# Patient Record
Sex: Female | Born: 1970 | Race: White | Hispanic: No | Marital: Single | Smoking: Never smoker
Health system: Southern US, Community
[De-identification: ages and names within clinical notes are randomized; demographics above are authoritative.]

## PROBLEM LIST (undated history)

## (undated) DIAGNOSIS — N938 Other specified abnormal uterine and vaginal bleeding: Secondary | ICD-10-CM

## (undated) DIAGNOSIS — N9489 Other specified conditions associated with female genital organs and menstrual cycle: Secondary | ICD-10-CM

## (undated) DIAGNOSIS — M1990 Unspecified osteoarthritis, unspecified site: Secondary | ICD-10-CM

## (undated) DIAGNOSIS — T7840XA Allergy, unspecified, initial encounter: Secondary | ICD-10-CM

## (undated) DIAGNOSIS — Z9289 Personal history of other medical treatment: Secondary | ICD-10-CM

## (undated) DIAGNOSIS — R7611 Nonspecific reaction to tuberculin skin test without active tuberculosis: Secondary | ICD-10-CM

## (undated) DIAGNOSIS — Z8619 Personal history of other infectious and parasitic diseases: Secondary | ICD-10-CM

## (undated) HISTORY — PX: CERVICAL DISCECTOMY: SHX98

## (undated) HISTORY — PX: ANTERIOR CRUCIATE LIGAMENT REPAIR: SHX115

## (undated) HISTORY — DX: Allergy, unspecified, initial encounter: T78.40XA

## (undated) HISTORY — DX: Personal history of other infectious and parasitic diseases: Z86.19

## (undated) HISTORY — PX: QUADRICEPS TENDON REPAIR: SHX756

## (undated) HISTORY — DX: Nonspecific reaction to tuberculin skin test without active tuberculosis: R76.11

## (undated) HISTORY — PX: OTHER SURGICAL HISTORY: SHX169

---

## 1994-06-04 HISTORY — PX: ANTERIOR CRUCIATE LIGAMENT (ACL) REVISION: SHX6707

## 1997-10-14 ENCOUNTER — Ambulatory Visit (HOSPITAL_COMMUNITY): Admission: RE | Admit: 1997-10-14 | Discharge: 1997-10-14 | Payer: Self-pay | Admitting: Obstetrics and Gynecology

## 1998-04-23 ENCOUNTER — Observation Stay (HOSPITAL_COMMUNITY): Admission: RE | Admit: 1998-04-23 | Discharge: 1998-04-24 | Payer: Self-pay | Admitting: Specialist

## 1998-04-23 ENCOUNTER — Encounter: Payer: Self-pay | Admitting: Specialist

## 2001-03-21 ENCOUNTER — Encounter: Admission: RE | Admit: 2001-03-21 | Discharge: 2001-03-21 | Payer: Self-pay | Admitting: Obstetrics and Gynecology

## 2001-03-21 ENCOUNTER — Encounter: Payer: Self-pay | Admitting: Obstetrics and Gynecology

## 2001-09-29 ENCOUNTER — Encounter: Payer: Self-pay | Admitting: Obstetrics and Gynecology

## 2001-09-29 ENCOUNTER — Encounter: Admission: RE | Admit: 2001-09-29 | Discharge: 2001-09-29 | Payer: Self-pay | Admitting: Obstetrics and Gynecology

## 2002-08-12 ENCOUNTER — Other Ambulatory Visit: Admission: RE | Admit: 2002-08-12 | Discharge: 2002-08-12 | Payer: Self-pay | Admitting: Obstetrics and Gynecology

## 2006-06-21 ENCOUNTER — Encounter: Admission: RE | Admit: 2006-06-21 | Discharge: 2006-06-21 | Payer: Self-pay | Admitting: Obstetrics and Gynecology

## 2006-07-03 ENCOUNTER — Encounter: Admission: RE | Admit: 2006-07-03 | Discharge: 2006-07-03 | Payer: Self-pay | Admitting: Obstetrics and Gynecology

## 2012-06-18 ENCOUNTER — Other Ambulatory Visit: Payer: Self-pay | Admitting: Obstetrics and Gynecology

## 2012-06-18 DIAGNOSIS — Z1231 Encounter for screening mammogram for malignant neoplasm of breast: Secondary | ICD-10-CM

## 2012-07-16 ENCOUNTER — Ambulatory Visit
Admission: RE | Admit: 2012-07-16 | Discharge: 2012-07-16 | Disposition: A | Discharge status: Home / Self Care | Payer: No Typology Code available for payment source | Source: Ambulatory Visit | Attending: Obstetrics and Gynecology | Admitting: Obstetrics and Gynecology

## 2012-07-16 DIAGNOSIS — Z1231 Encounter for screening mammogram for malignant neoplasm of breast: Secondary | ICD-10-CM

## 2015-06-05 HISTORY — PX: QUADRICEPS TENDON REPAIR: SHX756

## 2016-10-10 ENCOUNTER — Ambulatory Visit: Payer: No Typology Code available for payment source | Admitting: *Deleted

## 2016-10-17 ENCOUNTER — Ambulatory Visit: Payer: No Typology Code available for payment source | Admitting: *Deleted

## 2017-03-04 LAB — HM PAP SMEAR

## 2017-03-20 LAB — HEPATIC FUNCTION PANEL
ALT: 11 (ref 7–35)
AST: 23 (ref 13–35)
Alkaline Phosphatase: 41 (ref 25–125)
BILIRUBIN, TOTAL: 0.9

## 2017-03-20 LAB — LIPID PANEL
CHOLESTEROL: 174 (ref 0–200)
HDL: 103 — AB (ref 35–70)
LDL CALC: 57
LDL/HDL RATIO: 1.7
TRIGLYCERIDES: 63 (ref 40–160)

## 2017-03-20 LAB — BASIC METABOLIC PANEL
CREATININE: 1 (ref 0.5–1.1)
Glucose: 91
POTASSIUM: 5.3 (ref 3.4–5.3)
Sodium: 140 (ref 137–147)

## 2017-03-20 LAB — TSH: TSH: 2.24 (ref 0.41–5.90)

## 2017-03-20 LAB — CBC AND DIFFERENTIAL
HEMATOCRIT: 35 — AB (ref 36–46)
Hemoglobin: 11.8 — AB (ref 12.0–16.0)
Platelets: 285 (ref 150–399)
WBC: 4.8

## 2017-03-20 LAB — HEMOGLOBIN A1C: HEMOGLOBIN A1C: 5.4

## 2017-03-26 ENCOUNTER — Other Ambulatory Visit: Payer: Self-pay | Admitting: Obstetrics and Gynecology

## 2017-03-26 DIAGNOSIS — N6320 Unspecified lump in the left breast, unspecified quadrant: Secondary | ICD-10-CM

## 2017-04-03 ENCOUNTER — Ambulatory Visit
Admission: RE | Admit: 2017-04-03 | Discharge: 2017-04-03 | Disposition: A | Discharge status: Home / Self Care | Payer: 59 | Source: Ambulatory Visit | Attending: Obstetrics and Gynecology | Admitting: Obstetrics and Gynecology

## 2017-04-03 DIAGNOSIS — N6320 Unspecified lump in the left breast, unspecified quadrant: Secondary | ICD-10-CM

## 2017-04-03 LAB — HM MAMMOGRAPHY: HM Mammogram: NORMAL (ref 0–4)

## 2017-10-14 ENCOUNTER — Other Ambulatory Visit: Payer: Self-pay

## 2017-10-14 ENCOUNTER — Encounter: Payer: Self-pay | Admitting: Family Medicine

## 2017-10-14 ENCOUNTER — Ambulatory Visit (INDEPENDENT_AMBULATORY_CARE_PROVIDER_SITE_OTHER): Payer: BLUE CROSS/BLUE SHIELD | Admitting: Family Medicine

## 2017-10-14 VITALS — BP 101/68 | HR 66 | Temp 98.2°F | Resp 16 | Ht 66.5 in | Wt 141.0 lb

## 2017-10-14 DIAGNOSIS — G4700 Insomnia, unspecified: Secondary | ICD-10-CM

## 2017-10-14 DIAGNOSIS — L989 Disorder of the skin and subcutaneous tissue, unspecified: Secondary | ICD-10-CM

## 2017-10-14 MED ORDER — TRAZODONE HCL 50 MG PO TABS: 25.0000 mg | ORAL_TABLET | Freq: Every evening | ORAL | 3 refills | Status: DC | PRN

## 2017-10-14 NOTE — Patient Instructions (Signed)
Schedule your complete physical in 6 months Start the Trazodone nightly as needed.  Start w/ 1/2 tab and increase to 1 tab as needed We'll call you with your derm referral Keep up the good work on healthy diet and regular exercise- you look great! Call with any questions or concerns Welcome!  We're glad to have you!!!

## 2017-10-14 NOTE — Progress Notes (Signed)
   Subjective:    Patient ID: Bailey Hernandez, female    DOB: [birth year], 47 y.o.   MRN: [id number]  HPI New to establish.  No previous PCP.  GYN- Cousins, UTD on mammo and pap  Insomnia- pt has hx of poor sleep.  3 yrs ago was given a script for Restoril.  Has tried Melatonin, Benadryl, Sleepy Time tea.  Restoril will help her get to sleep but even w/ medication will wake 3-4x/night.  Benadryl causes her to wake 'groggy'.  Has never been on Trazodone.  Pt is not interested in taking something nightly.  Skin lesion- R shoulder/back of neck.  Not painful but will scab repeatedly.  Interested in seeing Derm.  Hx of non-melanoma skin cancer.     Review of Systems For ROS see HPI     Objective:   Physical Exam  Constitutional: She is oriented to person, place, and time. She appears well-developed and well-nourished. No distress.  HENT:  Head: Normocephalic and atraumatic.  Eyes: Pupils are equal, round, and reactive to light. Conjunctivae and EOM are normal.  Neck: Normal range of motion. Neck supple. No thyromegaly present.  Cardiovascular: Normal rate, regular rhythm, normal heart sounds and intact distal pulses.  No murmur heard. Pulmonary/Chest: Effort normal and breath sounds normal. No respiratory distress.  Abdominal: Soft. She exhibits no distension. There is no tenderness.  Musculoskeletal: She exhibits no edema.  Lymphadenopathy:    She has no cervical adenopathy.  Neurological: She is alert and oriented to person, place, and time.  Skin: Skin is warm and dry.  Scabbing, pearly lesion on R posterior shoulder  Psychiatric: She has a normal mood and affect. Her behavior is normal.  Vitals reviewed.         Assessment & Plan:  Insomnia- new to provider, ongoing for pt.  Has been taking the Restoril as needed but doesn't feel this is as effective as it should be.  Has not been on Trazodone in the past.  Will start Trazodone and monitor for improvement.  Pt expressed  understanding and is in agreement w/ plan.   Skin lesion- pt has hx of non-melanoma skin cancer and now w/ pearly, scabbed lesion on R posterior shoulder.  Refer to Derm for complete evaluation/tx.

## 2017-10-16 ENCOUNTER — Encounter: Payer: Self-pay | Admitting: Family Medicine

## 2017-10-23 ENCOUNTER — Encounter: Payer: Self-pay | Admitting: General Practice

## 2017-11-27 ENCOUNTER — Other Ambulatory Visit: Payer: Self-pay | Admitting: Physician Assistant

## 2017-11-27 DIAGNOSIS — C4441 Basal cell carcinoma of skin of scalp and neck: Secondary | ICD-10-CM | POA: Diagnosis not present

## 2018-05-07 ENCOUNTER — Encounter: Payer: Self-pay | Admitting: Family Medicine

## 2018-05-07 ENCOUNTER — Ambulatory Visit (INDEPENDENT_AMBULATORY_CARE_PROVIDER_SITE_OTHER): Payer: BLUE CROSS/BLUE SHIELD | Admitting: Family Medicine

## 2018-05-07 ENCOUNTER — Other Ambulatory Visit: Payer: Self-pay

## 2018-05-07 VITALS — BP 100/62 | HR 65 | Temp 98.0°F | Resp 16 | Ht 67.0 in | Wt 143.2 lb

## 2018-05-07 DIAGNOSIS — Z0000 Encounter for general adult medical examination without abnormal findings: Secondary | ICD-10-CM

## 2018-05-07 DIAGNOSIS — E785 Hyperlipidemia, unspecified: Secondary | ICD-10-CM | POA: Diagnosis not present

## 2018-05-07 LAB — BASIC METABOLIC PANEL
BUN: 12 mg/dL (ref 6–23)
CO2: 25 mEq/L (ref 19–32)
Calcium: 9.1 mg/dL (ref 8.4–10.5)
Chloride: 106 mEq/L (ref 96–112)
Creatinine, Ser: 0.8 mg/dL (ref 0.40–1.20)
GFR: 81 mL/min (ref >60.00)
Glucose, Bld: 80 mg/dL (ref 70–99)
Potassium: 4.3 mEq/L (ref 3.5–5.1)
SODIUM: 139 meq/L (ref 135–145)

## 2018-05-07 LAB — HEPATIC FUNCTION PANEL
ALT: 15 U/L (ref 0–35)
AST: 24 U/L (ref 0–37)
Albumin: 4.3 g/dL (ref 3.5–5.2)
Alkaline Phosphatase: 39 U/L (ref 39–117)
BILIRUBIN DIRECT: 0.2 mg/dL (ref 0.0–0.3)
Total Bilirubin: 1.4 mg/dL — ABNORMAL HIGH (ref 0.2–1.2)
Total Protein: 6.7 g/dL (ref 6.0–8.3)

## 2018-05-07 LAB — LIPID PANEL
Cholesterol: 170 mg/dL (ref 0–200)
HDL: 93.2 mg/dL (ref >39.00)
LDL Cholesterol: 65 mg/dL (ref 0–99)
NonHDL: 76.36
Total CHOL/HDL Ratio: 2
Triglycerides: 58 mg/dL (ref 0.0–149.0)
VLDL: 11.6 mg/dL (ref 0.0–40.0)

## 2018-05-07 LAB — CBC WITH DIFFERENTIAL/PLATELET
Basophils Absolute: 0.2 10*3/uL — ABNORMAL HIGH (ref 0.0–0.1)
Basophils Relative: 4.1 % — ABNORMAL HIGH (ref 0.0–3.0)
Eosinophils Absolute: 0.1 10*3/uL (ref 0.0–0.7)
Eosinophils Relative: 2 % (ref 0.0–5.0)
HCT: 39.2 % (ref 36.0–46.0)
Hemoglobin: 12.9 g/dL (ref 12.0–15.0)
Lymphocytes Relative: 27.6 % (ref 12.0–46.0)
Lymphs Abs: 1.1 10*3/uL (ref 0.7–4.0)
MCHC: 32.8 g/dL (ref 30.0–36.0)
MCV: 91.8 fl (ref 78.0–100.0)
Monocytes Absolute: 0.5 10*3/uL (ref 0.1–1.0)
Monocytes Relative: 11 % (ref 3.0–12.0)
Neutro Abs: 2.3 10*3/uL (ref 1.4–7.7)
Neutrophils Relative %: 55.3 % (ref 43.0–77.0)
Platelets: 310 10*3/uL (ref 150.0–400.0)
RBC: 4.27 Mil/uL (ref 3.87–5.11)
RDW: 14.6 % (ref 11.5–15.5)
WBC: 4.1 10*3/uL (ref 4.0–10.5)

## 2018-05-07 LAB — TSH: TSH: 2.4 u[IU]/mL (ref 0.35–4.50)

## 2018-05-07 NOTE — Assessment & Plan Note (Signed)
Pt's PE WNL.  UTD on GYN, immunizations.  Check labs.  Anticipatory guidance provided.  

## 2018-05-07 NOTE — Progress Notes (Signed)
   Subjective:    Patient ID: Bailey Hernandez, female    DOB: 09/28/1970, 47 y.o.   MRN: [id number]  HPI CPE- UTD on flu, Tdap.  UTD w/ Dr Cherly Hensenousins (GYN)   Review of Systems Patient reports no vision/ hearing changes, adenopathy,fever, weight change,  persistant/recurrent hoarseness , swallowing issues, chest pain, palpitations, edema, persistant/recurrent cough, hemoptysis, dyspnea (rest/exertional/paroxysmal nocturnal), gastrointestinal bleeding (melena, rectal bleeding), abdominal pain, significant heartburn, bowel changes, GU symptoms (dysuria, hematuria, incontinence), Gyn symptoms (abnormal  bleeding, pain),  syncope, focal weakness, memory loss, numbness & tingling, skin/hair/nail changes, abnormal bruising or bleeding, anxiety, or depression.     Objective:   Physical Exam General Appearance:    Alert, cooperative, no distress, appears stated age  Head:    Normocephalic, without obvious abnormality, atraumatic  Eyes:    PERRL, conjunctiva/corneas clear, EOM's intact, fundi    benign, both eyes  Ears:    Normal TM's and external ear canals, both ears  Nose:   Nares normal, septum midline, mucosa normal, no drainage    or sinus tenderness  Throat:   Lips, mucosa, and tongue normal; teeth and gums normal  Neck:   Supple, symmetrical, trachea midline, no adenopathy;    Thyroid: no enlargement/tenderness/nodules  Back:     Symmetric, no curvature, ROM normal, no CVA tenderness  Lungs:     Clear to auscultation bilaterally, respirations unlabored  Chest Wall:    No tenderness or deformity   Heart:    Regular rate and rhythm, S1 and S2 normal, no murmur, rub   or gallop  Breast Exam:    Deferred to GYN  Abdomen:     Soft, non-tender, bowel sounds active all four quadrants,    no masses, no organomegaly  Genitalia:    Deferred to GYN  Rectal:    Extremities:   Extremities normal, atraumatic, no cyanosis or edema  Pulses:   2+ and symmetric all extremities  Skin:   Skin color,  texture, turgor normal, no rashes or lesions  Lymph nodes:   Cervical, supraclavicular, and axillary nodes normal  Neurologic:   CNII-XII intact, normal strength, sensation and reflexes    throughout          Assessment & Plan:

## 2018-05-07 NOTE — Patient Instructions (Signed)
Follow up in 1 year or as needed We'll notify you of your lab results and make any changes if needed Keep up the good work!  You look great!! Call with any questions or concerns Happy Holidays!!! 

## 2018-09-01 DIAGNOSIS — Z124 Encounter for screening for malignant neoplasm of cervix: Secondary | ICD-10-CM | POA: Diagnosis not present

## 2018-09-01 DIAGNOSIS — Z118 Encounter for screening for other infectious and parasitic diseases: Secondary | ICD-10-CM | POA: Diagnosis not present

## 2018-09-01 DIAGNOSIS — Z1151 Encounter for screening for human papillomavirus (HPV): Secondary | ICD-10-CM | POA: Diagnosis not present

## 2018-09-01 DIAGNOSIS — Z113 Encounter for screening for infections with a predominantly sexual mode of transmission: Secondary | ICD-10-CM | POA: Diagnosis not present

## 2018-09-01 DIAGNOSIS — Z6822 Body mass index (BMI) 22.0-22.9, adult: Secondary | ICD-10-CM | POA: Diagnosis not present

## 2018-09-01 DIAGNOSIS — Z01419 Encounter for gynecological examination (general) (routine) without abnormal findings: Secondary | ICD-10-CM | POA: Diagnosis not present

## 2018-09-05 ENCOUNTER — Other Ambulatory Visit: Payer: Self-pay | Admitting: Obstetrics and Gynecology

## 2018-09-05 DIAGNOSIS — N6320 Unspecified lump in the left breast, unspecified quadrant: Secondary | ICD-10-CM

## 2018-09-25 ENCOUNTER — Ambulatory Visit
Admission: RE | Admit: 2018-09-25 | Discharge: 2018-09-25 | Disposition: A | Discharge status: Home / Self Care | Payer: BLUE CROSS/BLUE SHIELD | Source: Ambulatory Visit | Attending: Obstetrics and Gynecology | Admitting: Obstetrics and Gynecology

## 2018-09-25 ENCOUNTER — Other Ambulatory Visit: Payer: Self-pay

## 2018-09-25 DIAGNOSIS — N6320 Unspecified lump in the left breast, unspecified quadrant: Secondary | ICD-10-CM

## 2019-02-03 ENCOUNTER — Encounter: Payer: Self-pay | Admitting: Family Medicine

## 2019-04-01 DIAGNOSIS — R898 Other abnormal findings in specimens from other organs, systems and tissues: Secondary | ICD-10-CM | POA: Diagnosis not present

## 2019-04-01 DIAGNOSIS — N939 Abnormal uterine and vaginal bleeding, unspecified: Secondary | ICD-10-CM | POA: Diagnosis not present

## 2019-04-15 DIAGNOSIS — R1900 Intra-abdominal and pelvic swelling, mass and lump, unspecified site: Secondary | ICD-10-CM | POA: Diagnosis not present

## 2019-04-15 DIAGNOSIS — N938 Other specified abnormal uterine and vaginal bleeding: Secondary | ICD-10-CM | POA: Diagnosis not present

## 2019-04-15 DIAGNOSIS — R9389 Abnormal findings on diagnostic imaging of other specified body structures: Secondary | ICD-10-CM | POA: Diagnosis not present

## 2019-04-24 ENCOUNTER — Other Ambulatory Visit: Payer: Self-pay | Admitting: Obstetrics and Gynecology

## 2019-05-11 ENCOUNTER — Other Ambulatory Visit: Payer: Self-pay

## 2019-05-11 ENCOUNTER — Encounter (HOSPITAL_BASED_OUTPATIENT_CLINIC_OR_DEPARTMENT_OTHER): Payer: Self-pay | Admitting: *Deleted

## 2019-05-11 NOTE — Progress Notes (Signed)
Spoke w/ via phone for pre-op interview--- PT Lab needs dos----  CBC, Urine preg             Lab results------ none COVID test ------ 05-15-2019 @ 1400 Arrive at ------- 1230 NPO after ------ MN w/ exception clear liquids until 0830 then nothing by mouth (no cream/ milk products) Medications to take morning of surgery ----- NONE Diabetic medication ----- n/a Patient Special Instructions ----- n/a Pre-Op special Istructions ----- n/a Patient verbalized understanding of instructions that were given at this phone interview. Patient denies shortness of breath, chest pain, fever, cough a this phone interview.

## 2019-05-13 ENCOUNTER — Other Ambulatory Visit: Payer: Self-pay

## 2019-05-13 ENCOUNTER — Telehealth: Payer: Self-pay

## 2019-05-13 ENCOUNTER — Ambulatory Visit (INDEPENDENT_AMBULATORY_CARE_PROVIDER_SITE_OTHER): Payer: BC Managed Care – PPO | Admitting: Family Medicine

## 2019-05-13 ENCOUNTER — Encounter: Payer: Self-pay | Admitting: Family Medicine

## 2019-05-13 DIAGNOSIS — Z0000 Encounter for general adult medical examination without abnormal findings: Secondary | ICD-10-CM | POA: Diagnosis not present

## 2019-05-13 NOTE — Progress Notes (Signed)
I have discussed the procedure for the virtual visit with the patient who has given consent to proceed with assessment and treatment.   Pt unable to obtain vitals.   Bailey Hernandez, CMA     

## 2019-05-13 NOTE — Progress Notes (Signed)
   Virtual Visit via Video   I connected with patient on 05/13/19 at  8:00 AM EST by a video enabled telemedicine application and verified that I am speaking with the correct person using two identifiers.  Location patient: Home Location provider: Acup[facility]ist, Office Persons participating in the virtual visit: Patient, Provider, Antelope (Jess B)  I discussed the limitations of evaluation and management by telemedicine and the availability of in person appointments. The patient expressed understanding and agreed to proceed.  Subjective:   HPI:   CPE- UTD on GYN (has surgery upcoming), due for colonoscopy as she just turned 53.  UTD on immunizations.  ROS:  Patient reports no vision/ hearing changes, adenopathy,fever, weight change,  persistant/recurrent hoarseness , swallowing issues, chest pain, palpitations, edema, persistant/recurrent cough, hemoptysis, dyspnea (rest/exertional/paroxysmal nocturnal), gastrointestinal bleeding (melena, rectal bleeding), abdominal pain, significant heartburn, bowel changes, GU symptoms (dysuria, hematuria, incontinence), Gyn symptoms (abnormal  bleeding, pain),  syncope, focal weakness, memory loss, numbness & tingling, skin/hair/nail changes, abnormal bruising, anxiety, or depression.   + DUB  Patient Active Problem List   Diagnosis Date Noted  . Physical exam 05/07/2018    Social History   Tobacco Use  . Smoking status: Never Smoker  . Smokeless tobacco: Never Used  Substance Use Topics  . Alcohol use: Yes    Comment: social    Current Outpatient Medications:  .  megestrol (MEGACE) 40 MG tablet, Take 40 mg by mouth daily., Disp: , Rfl:  .  Omega-3 Fatty Acids (FISH OIL) 1000 MG CAPS, Take 2 capsules by mouth daily., Disp: , Rfl:   Allergies  Allergen Reactions  . Cephalexin Rash    Objective:   LMP  (LMP Unknown)  AAOx3, NAD NCAT, EOMI No obvious CN deficits Coloring WNL Pt is able to speak clearly, coherently without  shortness of breath or increased work of breathing.  Thought process is linear.  Mood is appropriate.   Assessment and Plan:   CPE- UTD on GYN, immunizations.  Due for colon cancer screen.  Prefers Cologuard due to [place].  Will hold until Jan 1 when her insurance changes.  Will get labs to risk stratify.  Anticipatory guidance provided.    Annye Asa, MD 05/13/2019

## 2019-05-13 NOTE — Telephone Encounter (Signed)
LMOVM advising patient to schedule CPE in 1 year 

## 2019-05-15 ENCOUNTER — Other Ambulatory Visit (HOSPITAL_COMMUNITY)
Admission: RE | Admit: 2019-05-15 | Discharge: 2019-05-15 | Disposition: A | Discharge status: Home / Self Care | Payer: BC Managed Care – PPO | Source: Ambulatory Visit | Attending: Obstetrics and Gynecology | Admitting: Obstetrics and Gynecology

## 2019-05-15 DIAGNOSIS — Z01812 Encounter for preprocedural laboratory examination: Secondary | ICD-10-CM | POA: Insufficient documentation

## 2019-05-15 DIAGNOSIS — Z20828 Contact with and (suspected) exposure to other viral communicable diseases: Secondary | ICD-10-CM | POA: Diagnosis not present

## 2019-05-15 LAB — SARS CORONAVIRUS 2 (TAT 6-24 HRS): SARS Coronavirus 2: NEGATIVE

## 2019-05-18 ENCOUNTER — Ambulatory Visit (HOSPITAL_BASED_OUTPATIENT_CLINIC_OR_DEPARTMENT_OTHER): Payer: BC Managed Care – PPO | Admitting: Anesthesiology

## 2019-05-18 ENCOUNTER — Encounter (HOSPITAL_BASED_OUTPATIENT_CLINIC_OR_DEPARTMENT_OTHER): Payer: Self-pay | Admitting: Obstetrics and Gynecology

## 2019-05-18 ENCOUNTER — Encounter (HOSPITAL_BASED_OUTPATIENT_CLINIC_OR_DEPARTMENT_OTHER)
Admission: RE | Disposition: A | Discharge status: Home / Self Care | Payer: Self-pay | Source: Home / Self Care | Attending: Obstetrics and Gynecology

## 2019-05-18 ENCOUNTER — Ambulatory Visit (HOSPITAL_BASED_OUTPATIENT_CLINIC_OR_DEPARTMENT_OTHER)
Admission: RE | Admit: 2019-05-18 | Discharge: 2019-05-18 | Disposition: A | Discharge status: Home / Self Care | Payer: BC Managed Care – PPO | Attending: Obstetrics and Gynecology | Admitting: Obstetrics and Gynecology

## 2019-05-18 DIAGNOSIS — D649 Anemia, unspecified: Secondary | ICD-10-CM | POA: Insufficient documentation

## 2019-05-18 DIAGNOSIS — N840 Polyp of corpus uteri: Secondary | ICD-10-CM | POA: Diagnosis not present

## 2019-05-18 DIAGNOSIS — N938 Other specified abnormal uterine and vaginal bleeding: Secondary | ICD-10-CM | POA: Insufficient documentation

## 2019-05-18 DIAGNOSIS — Z79899 Other long term (current) drug therapy: Secondary | ICD-10-CM | POA: Diagnosis not present

## 2019-05-18 HISTORY — PX: DILATATION & CURETTAGE/HYSTEROSCOPY WITH MYOSURE: SHX6511

## 2019-05-18 HISTORY — DX: Other specified conditions associated with female genital organs and menstrual cycle: N94.89

## 2019-05-18 HISTORY — DX: Personal history of other medical treatment: Z92.89

## 2019-05-18 HISTORY — DX: Other specified abnormal uterine and vaginal bleeding: N93.8

## 2019-05-18 HISTORY — DX: Unspecified osteoarthritis, unspecified site: M19.90

## 2019-05-18 LAB — CBC
HCT: 34.5 % — ABNORMAL LOW (ref 36.0–46.0)
Hemoglobin: 10.4 g/dL — ABNORMAL LOW (ref 12.0–15.0)
MCH: 24.4 pg — ABNORMAL LOW (ref 26.0–34.0)
MCHC: 30.1 g/dL (ref 30.0–36.0)
MCV: 80.8 fL (ref 80.0–100.0)
Platelets: 363 10*3/uL (ref 150–400)
RBC: 4.27 MIL/uL (ref 3.87–5.11)
RDW: 15.9 % — ABNORMAL HIGH (ref 11.5–15.5)
WBC: 5.8 10*3/uL (ref 4.0–10.5)
nRBC: 0 % (ref 0.0–0.2)

## 2019-05-18 SURGERY — DILATATION & CURETTAGE/HYSTEROSCOPY WITH MYOSURE
Anesthesia: General

## 2019-05-18 MED ORDER — ARTIFICIAL TEARS OPHTHALMIC OINT
TOPICAL_OINTMENT | OPHTHALMIC | Status: AC
  Filled 2019-05-18: qty 3.5

## 2019-05-18 MED ORDER — ONDANSETRON HCL 4 MG/2ML IJ SOLN
INTRAMUSCULAR | Status: DC | PRN
  Administered 2019-05-18: 4 mg via INTRAVENOUS

## 2019-05-18 MED ORDER — FENTANYL CITRATE (PF) 100 MCG/2ML IJ SOLN
INTRAMUSCULAR | Status: DC | PRN
  Administered 2019-05-18: 50 ug via INTRAVENOUS

## 2019-05-18 MED ORDER — PROPOFOL 10 MG/ML IV BOLUS
INTRAVENOUS | Status: AC
  Filled 2019-05-18: qty 20

## 2019-05-18 MED ORDER — PROPOFOL 10 MG/ML IV BOLUS
INTRAVENOUS | Status: DC | PRN
  Administered 2019-05-18: 200 mg via INTRAVENOUS

## 2019-05-18 MED ORDER — KETOROLAC TROMETHAMINE 30 MG/ML IJ SOLN
INTRAMUSCULAR | Status: DC | PRN
  Administered 2019-05-18: 30 mg via INTRAVENOUS

## 2019-05-18 MED ORDER — KETOROLAC TROMETHAMINE 30 MG/ML IJ SOLN
INTRAMUSCULAR | Status: AC
  Filled 2019-05-18: qty 1

## 2019-05-18 MED ORDER — DEXAMETHASONE SODIUM PHOSPHATE 10 MG/ML IJ SOLN
INTRAMUSCULAR | Status: DC | PRN
  Administered 2019-05-18: 5 mg via INTRAVENOUS

## 2019-05-18 MED ORDER — ONDANSETRON HCL 4 MG/2ML IJ SOLN
INTRAMUSCULAR | Status: AC
  Filled 2019-05-18: qty 2

## 2019-05-18 MED ORDER — MIDAZOLAM HCL 2 MG/2ML IJ SOLN
INTRAMUSCULAR | Status: DC | PRN
  Administered 2019-05-18: 2 mg via INTRAVENOUS

## 2019-05-18 MED ORDER — KETOROLAC TROMETHAMINE 60 MG/2ML IM SOLN
INTRAMUSCULAR | Status: DC | PRN
  Administered 2019-05-18: 30 mg via INTRAMUSCULAR

## 2019-05-18 MED ORDER — LIDOCAINE 2% (20 MG/ML) 5 ML SYRINGE
INTRAMUSCULAR | Status: AC
  Filled 2019-05-18: qty 5

## 2019-05-18 MED ORDER — FENTANYL CITRATE (PF) 100 MCG/2ML IJ SOLN
INTRAMUSCULAR | Status: AC
  Filled 2019-05-18: qty 2

## 2019-05-18 MED ORDER — LACTATED RINGERS IV SOLN
INTRAVENOUS | Status: DC
  Administered 2019-05-18: 13:00:00 via INTRAVENOUS
  Filled 2019-05-18: qty 1000

## 2019-05-18 MED ORDER — LIDOCAINE 2% (20 MG/ML) 5 ML SYRINGE
INTRAMUSCULAR | Status: DC | PRN
  Administered 2019-05-18: 60 mg via INTRAVENOUS

## 2019-05-18 MED ORDER — MIDAZOLAM HCL 2 MG/2ML IJ SOLN
INTRAMUSCULAR | Status: AC
  Filled 2019-05-18: qty 2

## 2019-05-18 MED ORDER — SODIUM CHLORIDE 0.9 % IR SOLN
Status: DC | PRN
  Administered 2019-05-18: 6000 mL

## 2019-05-18 MED ORDER — DEXAMETHASONE SODIUM PHOSPHATE 10 MG/ML IJ SOLN
INTRAMUSCULAR | Status: AC
  Filled 2019-05-18: qty 1

## 2019-05-18 MED ORDER — KETOROLAC TROMETHAMINE 30 MG/ML IJ SOLN: INTRAMUSCULAR | Status: DC | PRN

## 2019-05-18 SURGICAL SUPPLY — 25 items
BIPOLAR CUTTING LOOP 21FR (ELECTRODE)
CANISTER SUCT 3000ML PPV (MISCELLANEOUS) ×2 IMPLANT
CATH ROBINSON RED A/P 16FR (CATHETERS) IMPLANT
COVER WAND RF STERILE (DRAPES) ×2 IMPLANT
DEVICE MYOSURE LITE (MISCELLANEOUS) IMPLANT
DEVICE MYOSURE REACH (MISCELLANEOUS) ×1 IMPLANT
DILATOR CANAL MILEX (MISCELLANEOUS) IMPLANT
GAUZE 4X4 16PLY RFD (DISPOSABLE) ×2 IMPLANT
GLOVE BIOGEL PI IND STRL 7.0 (GLOVE) ×1 IMPLANT
GLOVE BIOGEL PI INDICATOR 7.0 (GLOVE) ×1
GLOVE ECLIPSE 6.5 STRL STRAW (GLOVE) ×2 IMPLANT
GOWN STRL REUS W/TWL LRG LVL3 (GOWN DISPOSABLE) ×2 IMPLANT
IV NS IRRIG 3000ML ARTHROMATIC (IV SOLUTION) ×2 IMPLANT
KIT PROCEDURE FLUENT (KITS) ×2 IMPLANT
KIT TURNOVER CYSTO (KITS) ×2 IMPLANT
LOOP CUTTING BIPOLAR 21FR (ELECTRODE) IMPLANT
MYOSURE XL FIBROID (MISCELLANEOUS)
PACK VAGINAL MINOR WOMEN LF (CUSTOM PROCEDURE TRAY) ×2 IMPLANT
PAD OB MATERNITY 4.3X12.25 (PERSONAL CARE ITEMS) ×2 IMPLANT
PAD PREP 24X48 CUFFED NSTRL (MISCELLANEOUS) ×2 IMPLANT
SEAL CERVICAL OMNI LOK (ABLATOR) IMPLANT
SEAL ROD LENS SCOPE MYOSURE (ABLATOR) ×2 IMPLANT
SYSTEM TISS REMOVAL MYOSURE XL (MISCELLANEOUS) IMPLANT
TOWEL OR 17X26 10 PK STRL BLUE (TOWEL DISPOSABLE) ×2 IMPLANT
WATER STERILE IRR 500ML POUR (IV SOLUTION) ×2 IMPLANT

## 2019-05-18 NOTE — Brief Op Note (Signed)
05/18/2019  3:37 PM  PATIENT:  Bailey Hernandez  48 y.o. female  PRE-OPERATIVE DIAGNOSIS:  Abnormal Uterine Bleeding, Endometrial Mass  POST-OPERATIVE DIAGNOSIS: Abnormal  Uterine Bleeding, Endometrial polyp  PROCEDURE:  diagnostic hysteroscopy, hysteroscopic resection of endometrial polyp, dilation and curettage  SURGEON:  Surgeon(s) and Role:    * Servando Salina, MD - Primary  PHYSICIAN ASSISTANT:   ASSISTANTS: none   ANESTHESIA:   general FINDINGS;  large polyp  noted , thickened endometrium EBL:  20 mL   BLOOD ADMINISTERED:none  DRAINS: none   LOCAL MEDICATIONS USED:  NONE  SPECIMEN:  Source of Specimen:  emc with polyp  DISPOSITION OF SPECIMEN:  PATHOLOGY  COUNTS:  YES  TOURNIQUET:  * No tourniquets in log *  DICTATION: .Other Dictation: Dictation Number [id number]  PLAN OF CARE: Discharge to home after PACU  PATIENT DISPOSITION:  PACU - hemodynamically stable.   Delay start of Pharmacological VTE agent (>24hrs) due to surgical blood loss or risk of bleeding: no

## 2019-05-18 NOTE — Anesthesia Preprocedure Evaluation (Addendum)
Anesthesia Evaluation  Patient identified by MRN, date of birth, ID band Patient awake    Reviewed: Allergy & Precautions, NPO status , Patient's Chart, lab work & pertinent test results  History of Anesthesia Complications Negative for: history of anesthetic complications  Airway Mallampati: I  TM Distance: >3 FB Neck ROM: Full    Dental  (+) Teeth Intact, Dental Advisory Given   Pulmonary neg pulmonary ROS,    Pulmonary exam normal        Cardiovascular Exercise Tolerance: Good negative cardio ROS Normal cardiovascular exam Rhythm:Regular Rate:Normal     Neuro/Psych negative neurological ROS  negative psych ROS   GI/Hepatic negative GI ROS, Neg liver ROS,   Endo/Other  negative endocrine ROS  Renal/GU negative Renal ROS   DUB/endometrial mass    Musculoskeletal negative musculoskeletal ROS (+)   Abdominal   Peds  Hematology  (+) anemia ,   Anesthesia Other Findings   Reproductive/Obstetrics                          Anesthesia Physical Anesthesia Plan  ASA: II  Anesthesia Plan: General   Post-op Pain Management:    Induction: Intravenous  PONV Risk Score and Plan: 3 and Ondansetron, Dexamethasone, Treatment may vary due to age or medical condition and Midazolam  Airway Management Planned: LMA  Additional Equipment: None  Intra-op Plan:   Post-operative Plan: Extubation in OR  Informed Consent: I have reviewed the patients History and Physical, chart, labs and discussed the procedure including the risks, benefits and alternatives for the proposed anesthesia with the patient or authorized representative who has indicated his/her understanding and acceptance.     Dental advisory given  Plan Discussed with:   Anesthesia Plan Comments:        Anesthesia Quick Evaluation

## 2019-05-18 NOTE — Transfer of Care (Signed)
Immediate Anesthesia Transfer of Care Note  Patient: Bailey Hernandez  Procedure(s) Performed: DILATATION & CURETTAGE/HYSTEROSCOPY WITH MYOSURE (N/A )  Patient Location: PACU  Anesthesia Type:General  Level of Consciousness: awake, alert , oriented and patient cooperative  Airway & Oxygen Therapy: Patient Spontanous Breathing and Patient connected to nasal cannula oxygen  Post-op Assessment: Report given to RN and Post -op Vital signs reviewed and stable  Post vital signs: Reviewed and stable  Last Vitals:  Vitals Value Taken Time  BP    Temp    Pulse    Resp    SpO2      Last Pain:  Vitals:   05/18/19 1250  TempSrc: Oral  PainSc: 0-No pain      Patients Stated Pain Goal: 5 (03/70/48 8891)  Complications: No apparent anesthesia complications

## 2019-05-18 NOTE — Anesthesia Procedure Notes (Signed)
Procedure Name: LMA Insertion Date/Time: 05/18/2019 2:39 PM Performed by: Wanita Chamberlain, CRNA Pre-anesthesia Checklist: Patient identified, Timeout performed, Emergency Drugs available, Suction available and Patient being monitored Patient Re-evaluated:Patient Re-evaluated prior to induction Oxygen Delivery Method: Circle system utilized Preoxygenation: Pre-oxygenation with 100% oxygen Induction Type: IV induction Ventilation: Mask ventilation without difficulty LMA: LMA inserted LMA Size: 4.0 Number of attempts: 1 Placement Confirmation: breath sounds checked- equal and bilateral,  CO2 detector and positive ETCO2

## 2019-05-18 NOTE — Discharge Instructions (Signed)
   D & C Home care Instructions:   Personal hygiene:  Used sanitary napkins for vaginal drainage not tampons. Shower or tub bathe the day after your procedure. No douching until bleeding stops. Always wipe from front to back after  Elimination.  Activity: Do not drive or operate any equipment today. The effects of the anesthesia are still present and drowsiness may result. Rest today, not necessarily flat bed rest, just take it easy. You may resume your normal activity in one to 2 days.  Sexual activity: No intercourse for one week or as indicated by your physician  Diet: Eat a light diet as desired this evening. You may resume a regular diet tomorrow.  Return to work: One to 2 days.  General Expectations of your surgery: Vaginal bleeding should be no heavier than a normal period. Spotting may continue up to 10 days. Mild cramps may continue for a couple of days. You may have a regular period in 2-6 weeks.  Unexpected observations call your doctor if these occur: persistent or heavy bleeding. Severe abdominal cramping or pain. Elevation of temperature greater than 100F.  Post Anesthesia Home Care Instructions  Activity: Get plenty of rest for the remainder of the day. A responsible individual must stay with you for 24 hours following the procedure.  For the next 24 hours, DO NOT: -Drive a car -Paediatric nurse -Drink alcoholic beverages -Take any medication unless instructed by your physician -Make any legal decisions or sign important papers.  Meals: Start with liquid foods such as gelatin or soup. Progress to regular foods as tolerated. Avoid greasy, spicy, heavy foods. If nausea and/or vomiting occur, drink only clear liquids until the nausea and/or vomiting subsides. Call your physician if vomiting continues.  Special Instructions/Symptoms: Your throat may feel dry or sore from the anesthesia or the breathing tube placed in your throat during surgery. If this causes discomfort,  gargle with warm salt water. The discomfort should disappear within 24 hours.  May take Ibuprofen at 9 PM. Toradol given at 1452.

## 2019-05-18 NOTE — H&P (Addendum)
Bailey Hernandez is an 48 y.o. female GO SF here for surgical mgmt of endometrial mass noted on sono hysterogram done for AUB, endometrial thickening on sonogram  Pertinent Gynecological History: Menses: dub Bleeding: dysfunctional uterine bleeding Contraception: none DES exposure: denies Blood transfusions: none Sexually transmitted diseases: no past history Previous GYN Procedures: DNC  Last mammogram: normal Date: 09/2018 Last pap: normal Date: 08/2018 OB History: G0   Menstrual History: Menarche age: n/a No LMP recorded (lmp unknown).    Past Medical History:  Diagnosis Date  . Arthritis   . DUB (dysfunctional uterine bleeding)   . Endometrial mass   . 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 ANTERIOR CRUCIATE LIGAMENT REPAIR Left 1994 approx.  . CERVICAL DISCECTOMY  2002 appox.   posterior approach  . QUADRICEPS TENDON REPAIR Left 2017    Family History  Problem Relation Age of Onset  . Healthy Mother   . Healthy Father   . Diabetes Maternal Grandmother   . Dementia Maternal Grandmother   . Cancer Maternal Grandfather        lung  . Dementia Paternal Grandmother   . Cancer Paternal Grandfather        colon    Social History:  reports that she has never smoked. She has never used smokeless tobacco. She reports current alcohol use. She reports that she does not use drugs.  Allergies:  Allergies  Allergen Reactions  . Cephalexin Rash    Medications Prior to Admission  Medication Sig Dispense Refill Last Dose  . ibuprofen (ADVIL) 200 MG tablet Take 200 mg by mouth as needed.     . megestrol (MEGACE) 40 MG tablet Take 40 mg by mouth daily.   05/17/2019 at Unknown time  . Omega-3 Fatty Acids (FISH OIL) 1000 MG CAPS Take 2 capsules by mouth daily.   Past Month at Unknown time    Review of Systems  All other systems reviewed and are negative.   Blood pressure 124/75, pulse 66,  temperature 98.4 F (36.9 C), temperature source Oral, resp. rate 14, height 5\' 6"  (1.676 m), weight 65.3 kg, SpO2 100 %. Physical Exam  Constitutional: She is oriented to person, place, and time. She appears well-developed and well-nourished.  HENT:  Head: Atraumatic.  Eyes: EOM are normal.  Cardiovascular: Normal rate and regular rhythm.  Respiratory: Effort normal.  Genitourinary:    Vagina and uterus normal.     Genitourinary Comments: Cervix closed Adnexal no palp mass AV uterus   Musculoskeletal:     Cervical back: Neck supple.  Neurological: She is alert and oriented to person, place, and time.  Skin: Skin is warm and dry.  Psychiatric: She has a normal mood and affect.    Results for orders placed or performed during the hospital encounter of 05/18/19 (from the past 24 hour(s))  CBC     Status: Abnormal   Collection Time: 05/18/19  1:00 PM  Result Value Ref Range   WBC 5.8 4.0 - 10.5 K/uL   RBC 4.27 3.87 - 5.11 MIL/uL   Hemoglobin 10.4 (L) 12.0 - 15.0 g/dL   HCT 05/20/19 (L) 92.1 - 19.4 %   MCV 80.8 80.0 - 100.0 fL   MCH 24.4 (L) 26.0 - 34.0 pg   MCHC 30.1 30.0 - 36.0 g/dL   RDW 17.4 (H) 08.1 - 44.8 %   Platelets 363 150 - 400 K/uL  nRBC 0.0 0.0 - 0.2 %      Assessment/Plan: DUB Endometrial mass Endometrial thickening on sonogram P) dx hysteroscopy, D&C, resection of endometrial mass. Risk of surgery reviewed including infection, bleeding, injury to surrounding organ structures, internal scar tissue, fluid overload and its mgmt, thermal injury, uterine perforation( 06/998) and its risk. All ? answered  Bailey Hernandez A Zhaire Locker 05/18/2019, 2:25 PM

## 2019-05-18 NOTE — Anesthesia Postprocedure Evaluation (Signed)
Anesthesia Post Note  Patient: Bailey Hernandez  Procedure(s) Performed: DILATATION & CURETTAGE/HYSTEROSCOPY WITH MYOSURE (N/A )     Patient location during evaluation: PACU Anesthesia Type: General Level of consciousness: awake and alert Pain management: pain level controlled Vital Signs Assessment: post-procedure vital signs reviewed and stable Respiratory status: spontaneous breathing, nonlabored ventilation and respiratory function stable Cardiovascular status: blood pressure returned to baseline and stable Postop Assessment: no apparent nausea or vomiting Anesthetic complications: no    Last Vitals:  Vitals:   05/18/19 1545 05/18/19 1610  BP: 135/88 123/82  Pulse: (!) 59 65  Resp: 16 18  Temp:    SpO2: 100% 100%    Last Pain:  Vitals:   05/18/19 1610  TempSrc:   PainSc: 0-No pain                 Lidia Collum

## 2019-05-19 LAB — SURGICAL PATHOLOGY

## 2019-05-19 NOTE — Op Note (Signed)
NAME: Bailey Hernandez, Bailey Hernandez MEDICAL RECORD [id number] ACCOUNT [id number] DATE OF BIRTH:1970-11-24 FACILITY: [facility] LOCATION: [facility] PHYSICIAN:Estanislao Harmon A. Shironda Kain, MD  OPERATIVE REPORT  DATE OF PROCEDURE:  05/18/2019  PREOPERATIVE DIAGNOSIS:  Abnormal uterine bleeding, endometrial mass, endometrial thickening on sonogram.  PROCEDURE PERFORMED:  Diagnostic hysteroscopy, hysteroscopic resection of endometrial polyp, dilation and curettage.  POSTOPERATIVE DIAGNOSIS:  Abnormal uterine bleeding, endometrial polyp.  ANESTHESIA:  General.  SURGEON:  Servando Salina, MD  ASSISTANT:  None.  DESCRIPTION OF PROCEDURE:  Under general anesthesia, the patient was placed in the dorsal lithotomy position.  She was sterilely prepped and draped in the usual fashion.  The patient had voided prior to entering the room and therefore she was not  catheterized.  Examination under anesthesia revealed an anteverted uterus.  No adnexal masses could be appreciated.  Bivalve speculum was placed in the vagina.  Parous cervix was noted.  Anterior lip of the cervix was grasped with a single tooth tenaculum.  The cervical os easily accepted a #19 Pratt dilator.  A diagnostic hysteroscope was introduced into the uterine cavity.  Posterior left large polypoid lesion was noted.  The right tubal ostia could be seen.  The left was not seen due to  the large polyp.  Using a REACH resectoscope, the endometrial cavity and the polyp was resected.  The left tubal ostia were ultimately seen.  When all polypoid lesions and thickness was resected, all instruments were then removed from the vagina.  SPECIMEN:  Labeled endometrial curettings with polyp were sent to pathology.  ESTIMATED BLOOD LOSS:  15 mL.  FLUID DEFICIT:  645 mL.  COMPLICATIONS:  None.  DISPOSITION:  The patient tolerated the procedure well and was transferred to recovery room in stable condition.  TN/NUANCE  D:05/19/2019 T:05/19/2019  JOB:[id number]

## 2020-02-01 ENCOUNTER — Encounter: Payer: Self-pay | Admitting: Family Medicine

## 2020-02-15 ENCOUNTER — Encounter: Payer: Self-pay | Admitting: Family Medicine

## 2020-09-08 ENCOUNTER — Other Ambulatory Visit (HOSPITAL_COMMUNITY): Payer: Self-pay | Admitting: Emergency Medicine

## 2020-09-15 ENCOUNTER — Ambulatory Visit (HOSPITAL_COMMUNITY)
Admission: RE | Admit: 2020-09-15 | Discharge: 2020-09-15 | Disposition: A | Discharge status: Home / Self Care | Payer: Self-pay | Source: Ambulatory Visit | Attending: Cardiology | Admitting: Cardiology

## 2020-11-22 ENCOUNTER — Other Ambulatory Visit: Payer: Self-pay | Admitting: Obstetrics and Gynecology

## 2020-11-22 DIAGNOSIS — Z1231 Encounter for screening mammogram for malignant neoplasm of breast: Secondary | ICD-10-CM

## 2020-11-29 ENCOUNTER — Other Ambulatory Visit: Payer: Self-pay

## 2020-11-29 ENCOUNTER — Ambulatory Visit
Admission: RE | Admit: 2020-11-29 | Discharge: 2020-11-29 | Disposition: A | Discharge status: Home / Self Care | Payer: No Typology Code available for payment source | Source: Ambulatory Visit | Attending: Obstetrics and Gynecology | Admitting: Obstetrics and Gynecology

## 2020-11-29 DIAGNOSIS — Z1231 Encounter for screening mammogram for malignant neoplasm of breast: Secondary | ICD-10-CM

## 2020-11-30 ENCOUNTER — Encounter: Payer: Self-pay | Admitting: *Deleted

## 2021-03-01 ENCOUNTER — Encounter: Payer: Self-pay | Admitting: Family Medicine

## 2021-03-01 ENCOUNTER — Other Ambulatory Visit: Payer: Self-pay

## 2021-03-01 ENCOUNTER — Ambulatory Visit (INDEPENDENT_AMBULATORY_CARE_PROVIDER_SITE_OTHER): Payer: No Typology Code available for payment source | Admitting: Family Medicine

## 2021-03-01 VITALS — BP 110/72 | HR 72 | Temp 97.9°F | Resp 16 | Ht 66.0 in | Wt 133.8 lb

## 2021-03-01 DIAGNOSIS — Z0000 Encounter for general adult medical examination without abnormal findings: Secondary | ICD-10-CM | POA: Diagnosis not present

## 2021-03-01 DIAGNOSIS — Z23 Encounter for immunization: Secondary | ICD-10-CM | POA: Diagnosis not present

## 2021-03-01 DIAGNOSIS — Z8639 Personal history of other endocrine, nutritional and metabolic disease: Secondary | ICD-10-CM | POA: Diagnosis not present

## 2021-03-01 DIAGNOSIS — F5101 Primary insomnia: Secondary | ICD-10-CM

## 2021-03-01 LAB — CBC WITH DIFFERENTIAL/PLATELET
Basophils Absolute: 0.1 10*3/uL (ref 0.0–0.1)
Basophils Relative: 3.5 % — ABNORMAL HIGH (ref 0.0–3.0)
Eosinophils Absolute: 0.1 10*3/uL (ref 0.0–0.7)
Eosinophils Relative: 1.8 % (ref 0.0–5.0)
HCT: 42.7 % (ref 36.0–46.0)
Hemoglobin: 14.3 g/dL (ref 12.0–15.0)
Lymphocytes Relative: 26.8 % (ref 12.0–46.0)
Lymphs Abs: 1 10*3/uL (ref 0.7–4.0)
MCHC: 33.5 g/dL (ref 30.0–36.0)
MCV: 96.4 fl (ref 78.0–100.0)
Monocytes Absolute: 0.4 10*3/uL (ref 0.1–1.0)
Monocytes Relative: 10.4 % (ref 3.0–12.0)
Neutro Abs: 2.1 10*3/uL (ref 1.4–7.7)
Neutrophils Relative %: 57.5 % (ref 43.0–77.0)
Platelets: 269 10*3/uL (ref 150.0–400.0)
RBC: 4.43 Mil/uL (ref 3.87–5.11)
RDW: 13 % (ref 11.5–15.5)
WBC: 3.7 10*3/uL — ABNORMAL LOW (ref 4.0–10.5)

## 2021-03-01 LAB — HEPATIC FUNCTION PANEL
ALT: 11 U/L (ref 0–35)
AST: 19 U/L (ref 0–37)
Albumin: 4.6 g/dL (ref 3.5–5.2)
Alkaline Phosphatase: 50 U/L (ref 39–117)
Bilirubin, Direct: 0.3 mg/dL (ref 0.0–0.3)
Total Bilirubin: 1.5 mg/dL — ABNORMAL HIGH (ref 0.2–1.2)
Total Protein: 6.8 g/dL (ref 6.0–8.3)

## 2021-03-01 LAB — LIPID PANEL
Cholesterol: 158 mg/dL (ref 0–200)
HDL: 88.7 mg/dL (ref >39.00)
LDL Cholesterol: 49 mg/dL (ref 0–99)
NonHDL: 69.32
Total CHOL/HDL Ratio: 2
Triglycerides: 103 mg/dL (ref 0.0–149.0)
VLDL: 20.6 mg/dL (ref 0.0–40.0)

## 2021-03-01 LAB — BASIC METABOLIC PANEL
BUN: 12 mg/dL (ref 6–23)
CO2: 28 mEq/L (ref 19–32)
Calcium: 9.3 mg/dL (ref 8.4–10.5)
Chloride: 103 mEq/L (ref 96–112)
Creatinine, Ser: 0.88 mg/dL (ref 0.40–1.20)
GFR: 75.84 mL/min (ref >60.00)
Glucose, Bld: 58 mg/dL — ABNORMAL LOW (ref 70–99)
Potassium: 4.1 mEq/L (ref 3.5–5.1)
Sodium: 140 mEq/L (ref 135–145)

## 2021-03-01 LAB — TSH: TSH: 2.11 u[IU]/mL (ref 0.35–5.50)

## 2021-03-01 LAB — VITAMIN D 25 HYDROXY (VIT D DEFICIENCY, FRACTURES): VITD: 28.58 ng/mL — ABNORMAL LOW (ref 30.00–100.00)

## 2021-03-01 MED ORDER — BELSOMRA 10 MG PO TABS: 1.0000 | ORAL_TABLET | Freq: Every day | ORAL | 3 refills | Status: DC

## 2021-03-01 NOTE — Assessment & Plan Note (Addendum)
Ongoing issue for pt.  Has tried OTC supplements, CBD, alcohol, Trazodone and nothing has helped.  She has Restoril to take as needed but this makes her nervous b/c it's a benzo.  No trouble falling asleep, the issue is staying asleep.  Discussed Belsomra and mechanism of action.  Discussed appropriate use and possible side effects.  Pt will start medication and see if this is helpful.

## 2021-03-01 NOTE — Patient Instructions (Addendum)
Follow up in 1 year or as needed We'll notify you of your lab results and make any changes if needed Keep up the good work!  You look great! STOP the Restoril START the Belsomra before bed Get your flu shot and COVID booster at your convenience 2nd Shingles shot is due in 2-6 months- you can schedule a nurse visit Call with any questions or concerns Happy Fall!!!

## 2021-03-01 NOTE — Progress Notes (Signed)
   Subjective:    Patient ID: Bailey Hernandez, female    DOB: 06-30-1970, 50 y.o.   MRN: [id number]  HPI CPE- UTD on Tdap, mammo, pap  Due for colonoscopy- scheduled w/ Dr Ewing Schlein.  Due for Hep C/HIV screen   Patient Care Team    Relationship Specialty Notifications Start End  Sheliah Hatch, MD PCP - General Family Medicine  10/14/17   Maxie Better, MD Consulting Physician Obstetrics and Gynecology  10/14/17     Health Maintenance  Topic Date Due  . Zoster Vaccines- Shingrix (1 of 2) Never done  . PAP SMEAR-Modifier  03/04/2020  . INFLUENZA VACCINE  09/01/2021 (Originally 01/02/2021)  . COLONOSCOPY (Pts 45-41yrs Insurance coverage will need to be confirmed)  03/01/2022 (Originally 04/10/2014)  . Hepatitis C Screening  03/01/2022 (Originally 04/11/1987)  . HIV Screening  03/01/2022 (Originally 04/10/1984)  . MAMMOGRAM  11/29/2021  . TETANUS/TDAP  10/15/2022  . COVID-19 Vaccine  Completed  . HPV VACCINES  Aged Out      Review of Systems Patient reports no vision/ hearing changes, adenopathy,fever, weight change,  persistant/recurrent hoarseness , swallowing issues, chest pain, palpitations, edema, persistant/recurrent cough, hemoptysis, dyspnea (rest/exertional/paroxysmal nocturnal), gastrointestinal bleeding (melena, rectal bleeding), abdominal pain, significant heartburn, bowel changes, GU symptoms (dysuria, hematuria, incontinence), Gyn symptoms (abnormal  bleeding, pain),  syncope, focal weakness, memory loss, numbness & tingling, skin/hair/nail changes, abnormal bruising or bleeding, anxiety, or depression.   + insomnia- currently on Temazepam as needed.  Has tried drinking 1-2 drinks/night w/o relief.  Has tried CBD, melatonin, flexeril, trazodone.  Has trouble staying asleep.  This visit occurred during the SARS-CoV-2 public health emergency.  Safety protocols were in place, including screening questions prior to the visit, additional usage of staff PPE, and extensive  cleaning of exam room while observing appropriate contact time as indicated for disinfecting solutions.      Objective:   Physical Exam General Appearance:    Alert, cooperative, no distress, appears stated age  Head:    Normocephalic, without obvious abnormality, atraumatic  Eyes:    PERRL, conjunctiva/corneas clear, EOM's intact, fundi    benign, both eyes  Ears:    Normal TM's and external ear canals, both ears  Nose:   Deferred to COVID  Throat:   Neck:   Supple, symmetrical, trachea midline, no adenopathy;    Thyroid: no enlargement/tenderness/nodules  Back:     Symmetric, no curvature, ROM normal, no CVA tenderness  Lungs:     Clear to auscultation bilaterally, respirations unlabored  Chest Wall:    No tenderness or deformity   Heart:    Regular rate and rhythm, S1 and S2 normal, no murmur, rub   or gallop  Breast Exam:    Deferred to GYN  Abdomen:     Soft, non-tender, bowel sounds active all four quadrants,    no masses, no organomegaly  Genitalia:    Deferred to GYN  Rectal:    Extremities:   Extremities normal, atraumatic, no cyanosis or edema  Pulses:   2+ and symmetric all extremities  Skin:   Skin color, texture, turgor normal, no rashes or lesions  Lymph nodes:   Cervical, supraclavicular, and axillary nodes normal  Neurologic:   CNII-XII intact, normal strength, sensation and reflexes    throughout          Assessment & Plan:

## 2021-03-01 NOTE — Assessment & Plan Note (Signed)
Pt's PE WNL.  UTD on pap, mammo, Tdap.  Colonoscopy scheduled.  Shingles shot given today.  Pt will wait for flu.  Plans to get COVID booster.  Check labs.  Anticipatory guidance provided.

## 2021-03-02 ENCOUNTER — Other Ambulatory Visit: Payer: Self-pay

## 2021-03-02 DIAGNOSIS — E559 Vitamin D deficiency, unspecified: Secondary | ICD-10-CM

## 2021-03-02 MED ORDER — VITAMIN D (ERGOCALCIFEROL) 1.25 MG (50000 UNIT) PO CAPS: 50000.0000 [IU] | ORAL_CAPSULE | ORAL | 0 refills | Status: DC

## 2021-03-02 MED ORDER — TEMAZEPAM 30 MG PO CAPS: 30.0000 mg | ORAL_CAPSULE | Freq: Every evening | ORAL | 3 refills | Status: DC | PRN

## 2021-03-27 LAB — HM COLONOSCOPY

## 2021-04-06 ENCOUNTER — Encounter: Payer: Self-pay | Admitting: Family Medicine

## 2021-06-21 ENCOUNTER — Other Ambulatory Visit: Payer: Self-pay

## 2021-06-21 MED ORDER — TEMAZEPAM 30 MG PO CAPS: 30.0000 mg | ORAL_CAPSULE | Freq: Every evening | ORAL | 3 refills | Status: DC | PRN

## 2021-06-21 NOTE — Telephone Encounter (Signed)
Caller name:Eudora Chew   Caller callback [phone]  Encourage patient to contact the pharmacy for refills or they can request refills through [facility]  (Please schedule appointment if patient has not been seen in over a year)  MEDICATION NAME & DOSE:temazepam (RESTORIL) 30 MG capsule   Notes/Comments from patient:Pt is two weeks early on medication   WHAT PHARMACY WOULD THEY LIKE THIS SENT TO: [id number] - [place], [place] - [address]   Please notify patient: It takes 48-72 hours to process rx refill requests Ask patient to call pharmacy to ensure rx is ready before heading there.   (CLINICAL TO FILL OR ROUTE PER PROTOCOLS)

## 2021-06-21 NOTE — Telephone Encounter (Signed)
Patient is requesting a refill of the following medications: Requested Prescriptions   Pending Prescriptions Disp Refills   temazepam (RESTORIL) 30 MG capsule 30 capsule 3    Sig: Take 1 capsule (30 mg total) by mouth at bedtime as needed for sleep.    Date of patient request: 06/21/21 Last office visit: 03/01/21 Date of last refill: 03/02/21 Last refill amount: 30 3R

## 2021-06-26 ENCOUNTER — Ambulatory Visit (INDEPENDENT_AMBULATORY_CARE_PROVIDER_SITE_OTHER): Payer: No Typology Code available for payment source | Admitting: Family Medicine

## 2021-06-26 DIAGNOSIS — Z23 Encounter for immunization: Secondary | ICD-10-CM | POA: Diagnosis not present

## 2021-06-26 NOTE — Progress Notes (Signed)
[name] is a 51 y.o. female presents to the office today for Shingrix injections, per physician's orders. Original order: Shingrix  Shingrix (route) was administered subQ in left arm (location) today. Patient tolerated injection. Patient due for follow up labs/provider appt: No.   Bailey Hernandez

## 2021-06-27 ENCOUNTER — Encounter: Payer: Self-pay | Admitting: Family Medicine

## 2021-08-19 IMAGING — MG MM DIGITAL SCREENING BILAT W/ TOMO AND CAD
8 series · 9 of 24 positions shown · non-contrast
Comparison: Previous exam(s).

CLINICAL DATA: Screening.

EXAM:
DIGITAL SCREENING BILATERAL MAMMOGRAM WITH TOMOSYNTHESIS AND CAD
TECHNIQUE: Bilateral screening digital craniocaudal and mediolateral oblique
mammograms were obtained. Bilateral screening digital breast
tomosynthesis was performed. The images were evaluated with
computer-aided detection.

[R MLO synth-2D]
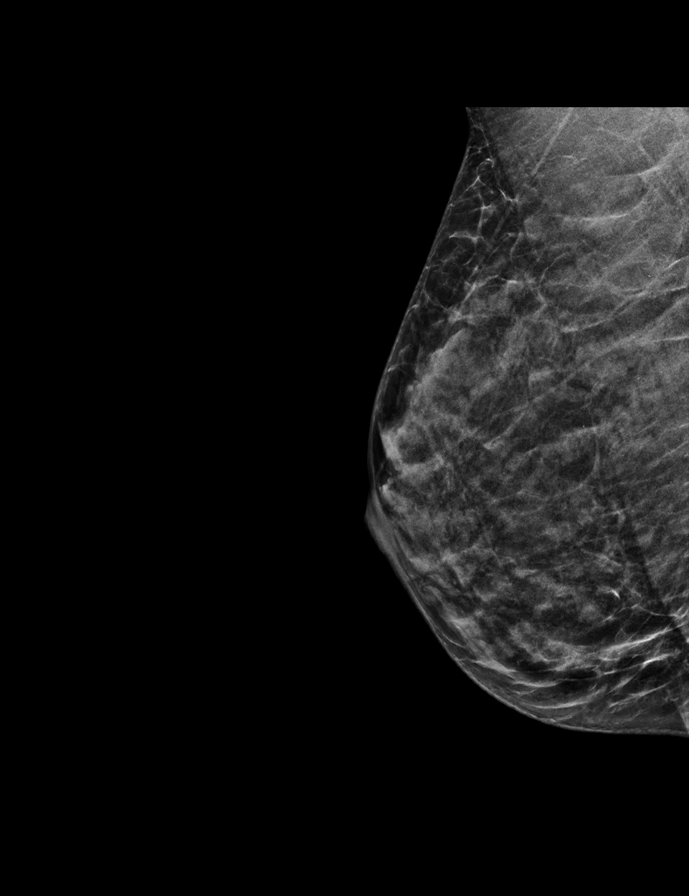

[L CC synth-2D]
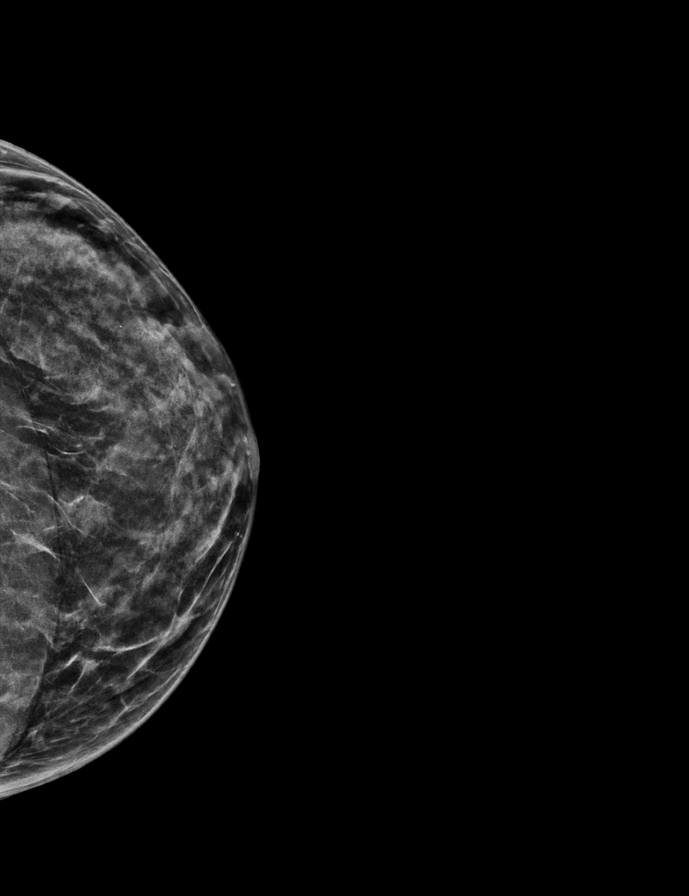

[R CC synth-2D]
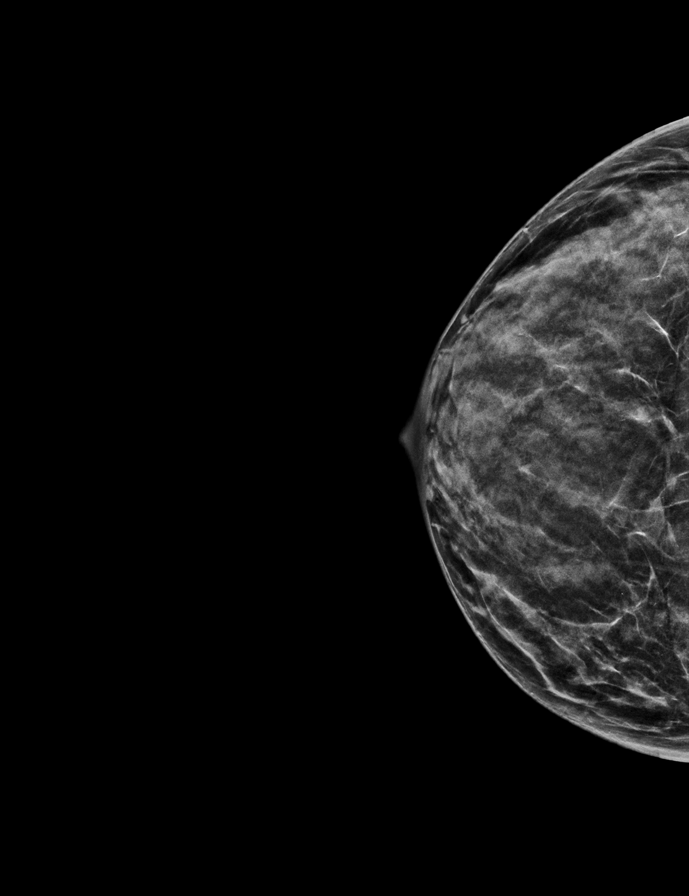

[L MLO synth-2D]
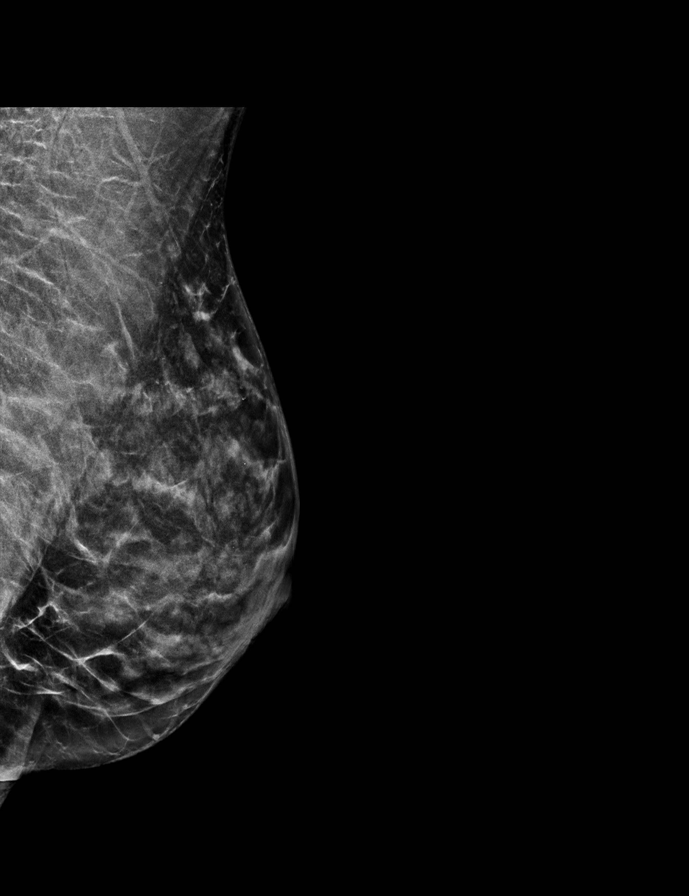

[R CC tomo · 2 of 40 frames shown]
[frame 13/40]
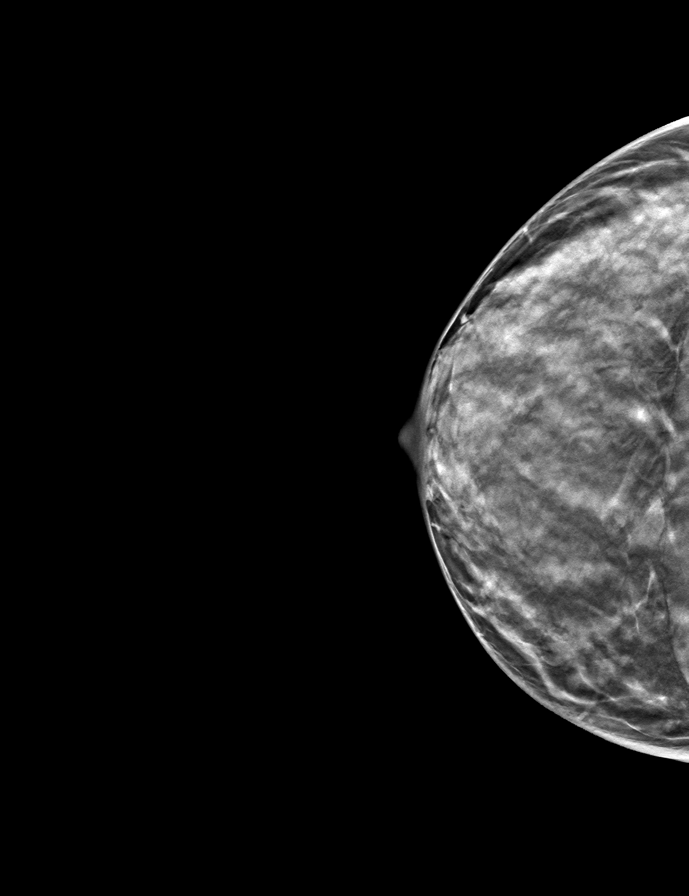
[frame 21/40]
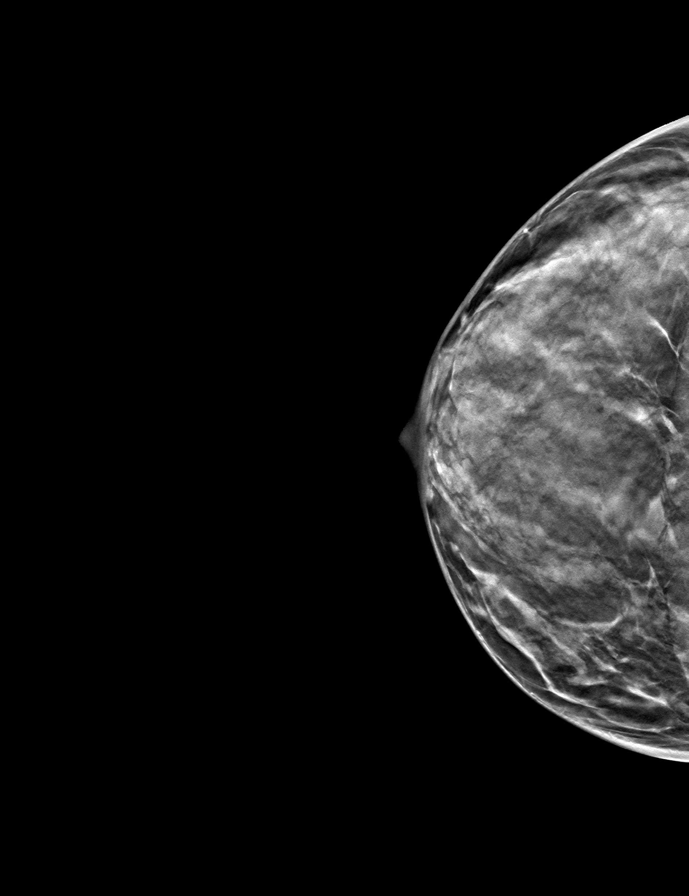

[L MLO tomo · tomo slice 27/52.0]
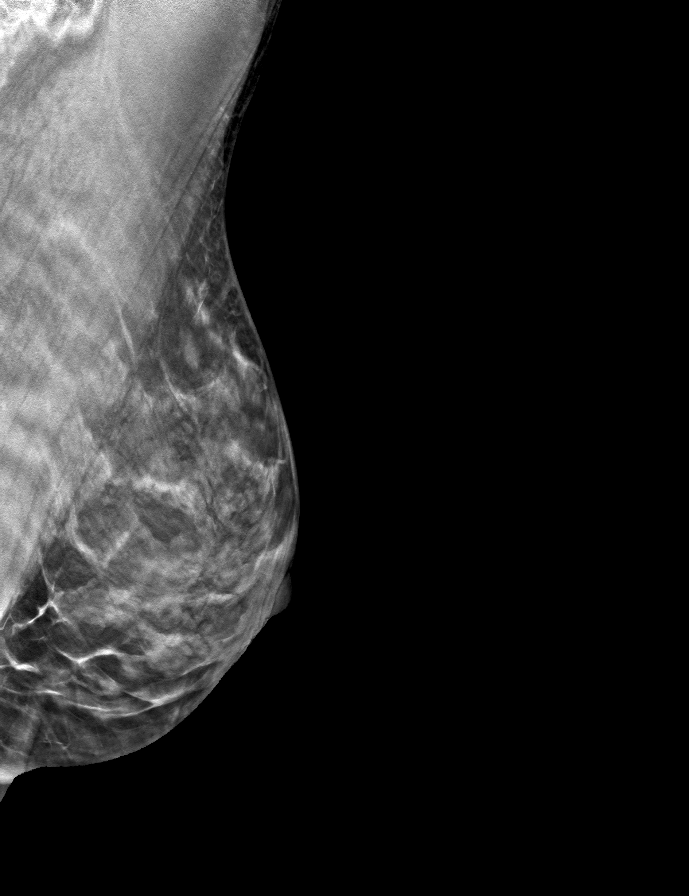

[L CC tomo · tomo slice 22/43.0]
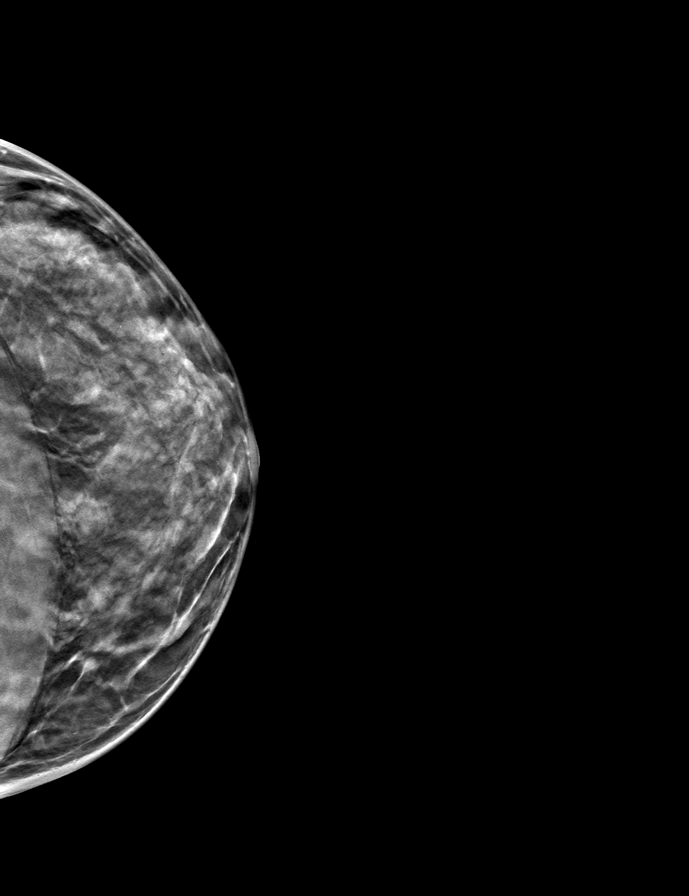

[R MLO tomo · tomo slice 21/42.0]
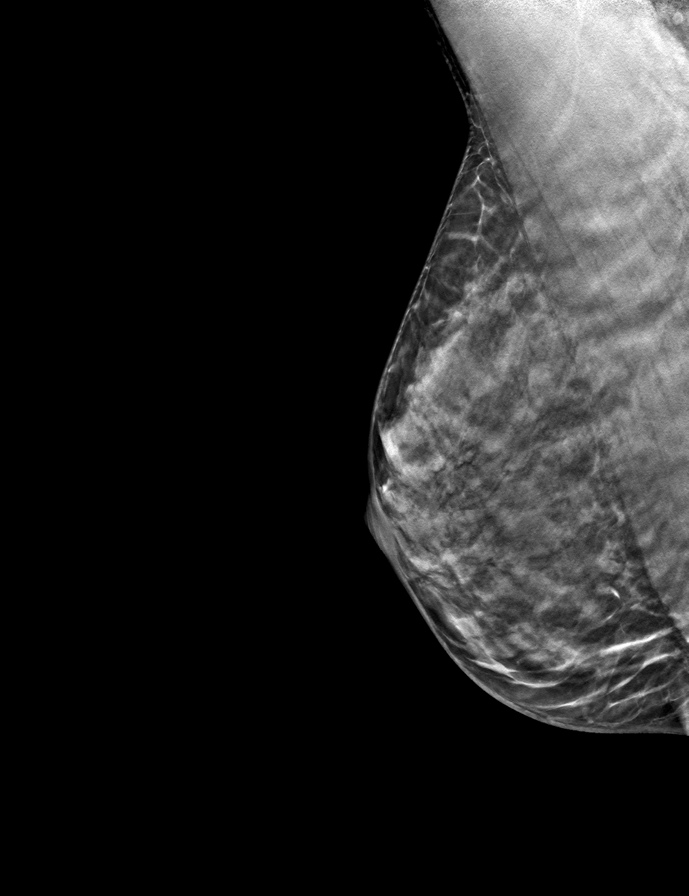

[9 of 24 positions shown; findings below may reference images not displayed]

ACR Breast Density Category d: The breast tissue is extremely dense,
which lowers the sensitivity of mammography
FINDINGS: There are no findings suspicious for malignancy.
IMPRESSION: No mammographic evidence of malignancy. A result letter of this
screening mammogram will be mailed directly to the patient.

RECOMMENDATION:
Screening mammogram in one year. (Code:TA-V-WV9)

BI-RADS CATEGORY  1: Negative.

## 2021-12-25 ENCOUNTER — Other Ambulatory Visit: Payer: Self-pay | Admitting: Obstetrics and Gynecology

## 2021-12-25 DIAGNOSIS — Z1231 Encounter for screening mammogram for malignant neoplasm of breast: Secondary | ICD-10-CM

## 2022-01-02 ENCOUNTER — Ambulatory Visit
Admission: RE | Admit: 2022-01-02 | Discharge: 2022-01-02 | Disposition: A | Discharge status: Home / Self Care | Payer: No Typology Code available for payment source | Source: Ambulatory Visit | Attending: Obstetrics and Gynecology | Admitting: Obstetrics and Gynecology

## 2022-01-02 DIAGNOSIS — Z1231 Encounter for screening mammogram for malignant neoplasm of breast: Secondary | ICD-10-CM

## 2022-01-03 ENCOUNTER — Other Ambulatory Visit: Payer: Self-pay | Admitting: Obstetrics and Gynecology

## 2022-01-03 DIAGNOSIS — R928 Other abnormal and inconclusive findings on diagnostic imaging of breast: Secondary | ICD-10-CM

## 2022-01-11 ENCOUNTER — Ambulatory Visit
Admission: RE | Admit: 2022-01-11 | Discharge: 2022-01-11 | Disposition: A | Discharge status: Home / Self Care | Payer: No Typology Code available for payment source | Source: Ambulatory Visit | Attending: Obstetrics and Gynecology | Admitting: Obstetrics and Gynecology

## 2022-01-11 DIAGNOSIS — R928 Other abnormal and inconclusive findings on diagnostic imaging of breast: Secondary | ICD-10-CM

## 2022-03-05 ENCOUNTER — Encounter: Payer: Self-pay | Admitting: Family Medicine

## 2022-03-05 ENCOUNTER — Ambulatory Visit (INDEPENDENT_AMBULATORY_CARE_PROVIDER_SITE_OTHER): Payer: No Typology Code available for payment source | Admitting: Family Medicine

## 2022-03-05 VITALS — BP 110/60 | HR 70 | Temp 97.6°F | Ht 66.0 in | Wt 134.8 lb

## 2022-03-05 DIAGNOSIS — Z0000 Encounter for general adult medical examination without abnormal findings: Secondary | ICD-10-CM

## 2022-03-05 DIAGNOSIS — Z8639 Personal history of other endocrine, nutritional and metabolic disease: Secondary | ICD-10-CM | POA: Diagnosis not present

## 2022-03-05 DIAGNOSIS — Z23 Encounter for immunization: Secondary | ICD-10-CM

## 2022-03-05 DIAGNOSIS — E559 Vitamin D deficiency, unspecified: Secondary | ICD-10-CM | POA: Diagnosis not present

## 2022-03-05 LAB — BASIC METABOLIC PANEL
BUN: 16 mg/dL (ref 6–23)
CO2: 28 mEq/L (ref 19–32)
Calcium: 9.4 mg/dL (ref 8.4–10.5)
Chloride: 103 mEq/L (ref 96–112)
Creatinine, Ser: 1.02 mg/dL (ref 0.40–1.20)
GFR: 63.08 mL/min (ref >60.00)
Glucose, Bld: 87 mg/dL (ref 70–99)
Potassium: 4.4 mEq/L (ref 3.5–5.1)
Sodium: 137 mEq/L (ref 135–145)

## 2022-03-05 LAB — CBC WITH DIFFERENTIAL/PLATELET
Basophils Absolute: 0.1 10*3/uL (ref 0.0–0.1)
Basophils Relative: 0.9 % (ref 0.0–3.0)
Eosinophils Absolute: 0 10*3/uL (ref 0.0–0.7)
Eosinophils Relative: 0.6 % (ref 0.0–5.0)
HCT: 40.7 % (ref 36.0–46.0)
Hemoglobin: 13.8 g/dL (ref 12.0–15.0)
Lymphocytes Relative: 15.7 % (ref 12.0–46.0)
Lymphs Abs: 1 10*3/uL (ref 0.7–4.0)
MCHC: 33.8 g/dL (ref 30.0–36.0)
MCV: 94.8 fl (ref 78.0–100.0)
Monocytes Absolute: 0.6 10*3/uL (ref 0.1–1.0)
Monocytes Relative: 9.7 % (ref 3.0–12.0)
Neutro Abs: 4.5 10*3/uL (ref 1.4–7.7)
Neutrophils Relative %: 73.1 % (ref 43.0–77.0)
Platelets: 245 10*3/uL (ref 150.0–400.0)
RBC: 4.29 Mil/uL (ref 3.87–5.11)
RDW: 13 % (ref 11.5–15.5)
WBC: 6.2 10*3/uL (ref 4.0–10.5)

## 2022-03-05 LAB — LIPID PANEL
Cholesterol: 158 mg/dL (ref 0–200)
HDL: 99.9 mg/dL (ref >39.00)
LDL Cholesterol: 48 mg/dL (ref 0–99)
NonHDL: 57.91
Total CHOL/HDL Ratio: 2
Triglycerides: 52 mg/dL (ref 0.0–149.0)
VLDL: 10.4 mg/dL (ref 0.0–40.0)

## 2022-03-05 LAB — HEPATIC FUNCTION PANEL
ALT: 12 U/L (ref 0–35)
AST: 22 U/L (ref 0–37)
Albumin: 4.4 g/dL (ref 3.5–5.2)
Alkaline Phosphatase: 47 U/L (ref 39–117)
Bilirubin, Direct: 0.3 mg/dL (ref 0.0–0.3)
Total Bilirubin: 1.3 mg/dL — ABNORMAL HIGH (ref 0.2–1.2)
Total Protein: 6.7 g/dL (ref 6.0–8.3)

## 2022-03-05 LAB — VITAMIN D 25 HYDROXY (VIT D DEFICIENCY, FRACTURES): VITD: 83.74 ng/mL (ref 30.00–100.00)

## 2022-03-05 LAB — TSH: TSH: 2.43 u[IU]/mL (ref 0.35–5.50)

## 2022-03-05 NOTE — Progress Notes (Signed)
   Subjective:    Patient ID: Bailey Hernandez, female    DOB: 1970-12-11, 51 y.o.   MRN: [id number]  HPI CPE- UTD on Tdap, mammo, pap, colonoscopy  Patient Care Team    Relationship Specialty Notifications Start End  Midge Minium, MD PCP - General Family Medicine  10/14/17   Servando Salina, MD Consulting Physician Obstetrics and Gynecology  10/14/17     Health Maintenance  Topic Date Due   HIV Screening  Never done   Hepatitis C Screening  Never done   COVID-19 Vaccine (4 - Pfizer series) 12/01/2020   INFLUENZA VACCINE  01/02/2022   TETANUS/TDAP  10/15/2022   MAMMOGRAM  01/03/2023   PAP SMEAR-Modifier  12/13/2023   COLONOSCOPY (Pts 45-75yrs Insurance coverage will need to be confirmed)  03/28/2031   Zoster Vaccines- Shingrix  Completed   HPV VACCINES  Aged Out     Review of Systems Patient reports no vision/ hearing changes, adenopathy,fever, weight change,  persistant/recurrent hoarseness , swallowing issues, chest pain, palpitations, edema, persistant/recurrent cough, hemoptysis, dyspnea (rest/exertional/paroxysmal nocturnal), gastrointestinal bleeding (melena, rectal bleeding), abdominal pain, significant heartburn, bowel changes, GU symptoms (dysuria, hematuria, incontinence), Gyn symptoms (abnormal  bleeding, pain),  syncope, focal weakness, memory loss, numbness & tingling, skin/hair/nail changes, abnormal bruising or bleeding, anxiety, or depression.     Objective:   Physical Exam General Appearance:    Alert, cooperative, no distress, appears stated age  Head:    Normocephalic, without obvious abnormality, atraumatic  Eyes:    PERRL, conjunctiva/corneas clear, EOM's intact both eyes  Ears:    Normal TM's and external ear canals, both ears  Nose:   Nares normal, septum midline, mucosa normal, no drainage    or sinus tenderness  Throat:   Lips, mucosa, and tongue normal; teeth and gums normal  Neck:   Supple, symmetrical, trachea midline, no adenopathy;     Thyroid: no enlargement/tenderness/nodules  Back:     Symmetric, no curvature, ROM normal, no CVA tenderness  Lungs:     Clear to auscultation bilaterally, respirations unlabored  Chest Wall:    No tenderness or deformity   Heart:    Regular rate and rhythm, S1 and S2 normal, no murmur, rub   or gallop  Breast Exam:    Deferred to GYN  Abdomen:     Soft, non-tender, bowel sounds active all four quadrants,    no masses, no organomegaly  Genitalia:    Deferred to GYN  Rectal:    Extremities:   Extremities normal, atraumatic, no cyanosis or edema  Pulses:   2+ and symmetric all extremities  Skin:   Skin color, texture, turgor normal, no rashes or lesions  Lymph nodes:   Cervical, supraclavicular, and axillary nodes normal  Neurologic:   CNII-XII intact, normal strength, sensation and reflexes    throughout          Assessment & Plan:

## 2022-03-05 NOTE — Patient Instructions (Addendum)
Follow up in 1 year or as needed We'll notify you of your lab results and make any changes if needed Keep up the good work on healthy diet and regular exercise- you look great!! Call with any questions or concerns Stay Safe!  Stay Healthy! CONGRATS ON THE HOUSE!!!

## 2022-03-05 NOTE — Assessment & Plan Note (Signed)
Pt's PE WNL.  UTD on Tdap, mammo, pap, colonoscopy.  Will get flu shot at work.  Check labs.  Anticipatory guidance provided.

## 2022-03-06 ENCOUNTER — Telehealth: Payer: Self-pay

## 2022-03-06 NOTE — Telephone Encounter (Signed)
Left pt a vm satiating lab results

## 2022-03-06 NOTE — Telephone Encounter (Signed)
-----   Message from Midge Minium, MD sent at 03/06/2022  7:24 AM EDT ----- Labs look great!  No changes at this time

## 2023-01-02 LAB — HM PAP SMEAR: HPV, high-risk: NEGATIVE

## 2023-02-05 ENCOUNTER — Other Ambulatory Visit: Payer: Self-pay | Admitting: Obstetrics and Gynecology

## 2023-02-05 DIAGNOSIS — Z1231 Encounter for screening mammogram for malignant neoplasm of breast: Secondary | ICD-10-CM

## 2023-02-19 ENCOUNTER — Ambulatory Visit: Payer: No Typology Code available for payment source

## 2023-03-11 ENCOUNTER — Encounter: Payer: Self-pay | Admitting: Family Medicine

## 2023-03-11 ENCOUNTER — Ambulatory Visit (INDEPENDENT_AMBULATORY_CARE_PROVIDER_SITE_OTHER): Payer: No Typology Code available for payment source | Admitting: Family Medicine

## 2023-03-11 VITALS — BP 114/62 | HR 63 | Temp 97.9°F | Ht 66.0 in | Wt 137.0 lb

## 2023-03-11 DIAGNOSIS — Z8639 Personal history of other endocrine, nutritional and metabolic disease: Secondary | ICD-10-CM

## 2023-03-11 DIAGNOSIS — E559 Vitamin D deficiency, unspecified: Secondary | ICD-10-CM

## 2023-03-11 DIAGNOSIS — Z0000 Encounter for general adult medical examination without abnormal findings: Secondary | ICD-10-CM | POA: Diagnosis not present

## 2023-03-11 LAB — CBC WITH DIFFERENTIAL/PLATELET
Basophils Absolute: 0.1 10*3/uL (ref 0.0–0.1)
Basophils Relative: 0.9 % (ref 0.0–3.0)
Eosinophils Absolute: 0.1 10*3/uL (ref 0.0–0.7)
Eosinophils Relative: 0.9 % (ref 0.0–5.0)
HCT: 42.7 % (ref 36.0–46.0)
Hemoglobin: 14.1 g/dL (ref 12.0–15.0)
Lymphocytes Relative: 21.7 % (ref 12.0–46.0)
Lymphs Abs: 1.3 10*3/uL (ref 0.7–4.0)
MCHC: 33.1 g/dL (ref 30.0–36.0)
MCV: 94.7 fL (ref 78.0–100.0)
Monocytes Absolute: 0.4 10*3/uL (ref 0.1–1.0)
Monocytes Relative: 6.7 % (ref 3.0–12.0)
Neutro Abs: 4.2 10*3/uL (ref 1.4–7.7)
Neutrophils Relative %: 69.8 % (ref 43.0–77.0)
Platelets: 306 10*3/uL (ref 150.0–400.0)
RBC: 4.5 Mil/uL (ref 3.87–5.11)
RDW: 13.3 % (ref 11.5–15.5)
WBC: 6 10*3/uL (ref 4.0–10.5)

## 2023-03-11 LAB — HEPATIC FUNCTION PANEL
ALT: 15 U/L (ref 0–35)
AST: 23 U/L (ref 0–37)
Albumin: 4.6 g/dL (ref 3.5–5.2)
Alkaline Phosphatase: 58 U/L (ref 39–117)
Bilirubin, Direct: 0.2 mg/dL (ref 0.0–0.3)
Total Bilirubin: 1.2 mg/dL (ref 0.2–1.2)
Total Protein: 7 g/dL (ref 6.0–8.3)

## 2023-03-11 LAB — LIPID PANEL
Cholesterol: 182 mg/dL (ref 0–200)
HDL: 109.3 mg/dL (ref >39.00)
LDL Cholesterol: 63 mg/dL (ref 0–99)
NonHDL: 72.98
Total CHOL/HDL Ratio: 2
Triglycerides: 52 mg/dL (ref 0.0–149.0)
VLDL: 10.4 mg/dL (ref 0.0–40.0)

## 2023-03-11 LAB — BASIC METABOLIC PANEL
BUN: 18 mg/dL (ref 6–23)
CO2: 27 meq/L (ref 19–32)
Calcium: 9.7 mg/dL (ref 8.4–10.5)
Chloride: 103 meq/L (ref 96–112)
Creatinine, Ser: 0.9 mg/dL (ref 0.40–1.20)
GFR: 72.78 mL/min (ref >60.00)
Glucose, Bld: 93 mg/dL (ref 70–99)
Potassium: 4.1 meq/L (ref 3.5–5.1)
Sodium: 140 meq/L (ref 135–145)

## 2023-03-11 LAB — VITAMIN D 25 HYDROXY (VIT D DEFICIENCY, FRACTURES): VITD: 63.72 ng/mL (ref 30.00–100.00)

## 2023-03-11 LAB — TSH: TSH: 2.95 u[IU]/mL (ref 0.35–5.50)

## 2023-03-11 NOTE — Progress Notes (Signed)
   Subjective:    Patient ID: Bailey Hernandez, female    DOB: 01/28/1971, 52 y.o.   MRN: [id number]  HPI CPE- UTD on pap, colonoscopy.  Mammo scheduled.  Patient Care Team    Relationship Specialty Notifications Start End  Sheliah Hatch, MD PCP - General Family Medicine  10/14/17   Maxie Better, MD Consulting Physician Obstetrics and Gynecology  10/14/17     Health Maintenance  Topic Date Due   HIV Screening  Never done   DTaP/Tdap/Td (2 - Td or Tdap) 10/15/2022   INFLUENZA VACCINE  01/03/2023   MAMMOGRAM  01/03/2023   COVID-19 Vaccine (6 - 2023-24 season) 02/03/2023   Cervical Cancer Screening (HPV/Pap Cotest)  12/13/2023   Colonoscopy  03/28/2031   Zoster Vaccines- Shingrix  Completed   HPV VACCINES  Aged Out   Hepatitis C Screening  Discontinued     Review of Systems Patient reports no vision/ hearing changes, adenopathy,fever, weight change,  persistant/recurrent hoarseness , swallowing issues, chest pain, palpitations, edema, persistant/recurrent cough, hemoptysis, dyspnea (rest/exertional/paroxysmal nocturnal), gastrointestinal bleeding (melena, rectal bleeding), abdominal pain, significant heartburn, bowel changes, GU symptoms (dysuria, hematuria, incontinence), Gyn symptoms (abnormal  bleeding, pain),  syncope, focal weakness, memory loss, numbness & tingling, skin/hair/nail changes, abnormal bruising or bleeding, anxiety, or depression.     Objective:   Physical Exam General Appearance:    Alert, cooperative, no distress, appears stated age  Head:    Normocephalic, without obvious abnormality, atraumatic  Eyes:    PERRL, conjunctiva/corneas clear, EOM's intact both eyes  Ears:    Normal TM's and external ear canals, both ears  Nose:   Nares normal, septum midline, mucosa normal, no drainage    or sinus tenderness  Throat:   Lips, mucosa, and tongue normal; teeth and gums normal  Neck:   Supple, symmetrical, trachea midline, no adenopathy;    Thyroid: no  enlargement/tenderness/nodules  Back:     Symmetric, no curvature, ROM normal, no CVA tenderness  Lungs:     Clear to auscultation bilaterally, respirations unlabored  Chest Wall:    No tenderness or deformity   Heart:    Regular rate and rhythm, S1 and S2 normal, no murmur, rub   or gallop  Breast Exam:    Deferred to GYN  Abdomen:     Soft, non-tender, bowel sounds active all four quadrants,    no masses, no organomegaly  Genitalia:    Deferred to GYN  Rectal:    Extremities:   Extremities normal, atraumatic, no cyanosis or edema  Pulses:   2+ and symmetric all extremities  Skin:   Skin color, texture, turgor normal, no rashes or lesions  Lymph nodes:   Cervical, supraclavicular, and axillary nodes normal  Neurologic:   CNII-XII intact, normal strength, sensation and reflexes    throughout          Assessment & Plan:

## 2023-03-11 NOTE — Patient Instructions (Signed)
Follow up in 1 year or as needed We'll notify you of your lab results and make any changes if needed Keep up the good work on healthy diet and regular exercise- you look great! Call with any questions or concerns Stay Safe!  Stay Healthy! Happy Fall!!!

## 2023-03-11 NOTE — Assessment & Plan Note (Signed)
Pt's PE WNL.  UTD on pap, colonoscopy.  Mammo is scheduled.  Will get Tdap (out of stock today).  Check labs.  Anticipatory guidance provided.

## 2023-03-12 ENCOUNTER — Telehealth: Payer: Self-pay

## 2023-03-12 NOTE — Telephone Encounter (Signed)
Spoke to patient and made her aware of labs. No questions at this time

## 2023-03-12 NOTE — Telephone Encounter (Signed)
-----   Message from Neena Rhymes sent at 03/12/2023  8:09 AM EDT ----- Labs look great!  No changes at this time

## 2023-04-02 ENCOUNTER — Ambulatory Visit
Admission: RE | Admit: 2023-04-02 | Discharge: 2023-04-02 | Disposition: A | Discharge status: Home / Self Care | Payer: No Typology Code available for payment source | Source: Ambulatory Visit | Attending: Obstetrics and Gynecology | Admitting: Obstetrics and Gynecology

## 2023-04-02 DIAGNOSIS — Z1231 Encounter for screening mammogram for malignant neoplasm of breast: Secondary | ICD-10-CM

## 2024-03-11 ENCOUNTER — Encounter: Payer: Self-pay | Admitting: Family Medicine

## 2024-03-11 ENCOUNTER — Ambulatory Visit (INDEPENDENT_AMBULATORY_CARE_PROVIDER_SITE_OTHER): Payer: No Typology Code available for payment source | Admitting: Family Medicine

## 2024-03-11 ENCOUNTER — Other Ambulatory Visit: Payer: Self-pay | Admitting: Obstetrics and Gynecology

## 2024-03-11 VITALS — BP 114/74 | HR 68 | Temp 98.1°F | Resp 14 | Ht 66.0 in | Wt 144.6 lb

## 2024-03-11 DIAGNOSIS — Z0000 Encounter for general adult medical examination without abnormal findings: Secondary | ICD-10-CM

## 2024-03-11 DIAGNOSIS — Z114 Encounter for screening for human immunodeficiency virus [HIV]: Secondary | ICD-10-CM

## 2024-03-11 DIAGNOSIS — Z23 Encounter for immunization: Secondary | ICD-10-CM

## 2024-03-11 DIAGNOSIS — Z8639 Personal history of other endocrine, nutritional and metabolic disease: Secondary | ICD-10-CM

## 2024-03-11 DIAGNOSIS — Z1159 Encounter for screening for other viral diseases: Secondary | ICD-10-CM

## 2024-03-11 DIAGNOSIS — Z1231 Encounter for screening mammogram for malignant neoplasm of breast: Secondary | ICD-10-CM

## 2024-03-11 DIAGNOSIS — E559 Vitamin D deficiency, unspecified: Secondary | ICD-10-CM

## 2024-03-11 LAB — CBC WITH DIFFERENTIAL/PLATELET
Basophils Absolute: 0.1 K/uL (ref 0.0–0.1)
Basophils Relative: 2.5 % (ref 0.0–3.0)
Eosinophils Absolute: 0.1 K/uL (ref 0.0–0.7)
Eosinophils Relative: 2 % (ref 0.0–5.0)
HCT: 42.1 % (ref 36.0–46.0)
Hemoglobin: 13.8 g/dL (ref 12.0–15.0)
Lymphocytes Relative: 36.1 % (ref 12.0–46.0)
Lymphs Abs: 1.3 K/uL (ref 0.7–4.0)
MCHC: 32.7 g/dL (ref 30.0–36.0)
MCV: 95.2 fl (ref 78.0–100.0)
Monocytes Absolute: 0.3 K/uL (ref 0.1–1.0)
Monocytes Relative: 9.5 % (ref 3.0–12.0)
Neutro Abs: 1.8 K/uL (ref 1.4–7.7)
Neutrophils Relative %: 49.9 % (ref 43.0–77.0)
Platelets: 295 K/uL (ref 150.0–400.0)
RBC: 4.43 Mil/uL (ref 3.87–5.11)
RDW: 13.6 % (ref 11.5–15.5)
WBC: 3.6 K/uL — ABNORMAL LOW (ref 4.0–10.5)

## 2024-03-11 LAB — BASIC METABOLIC PANEL WITH GFR
BUN: 16 mg/dL (ref 6–23)
CO2: 28 meq/L (ref 19–32)
Calcium: 9.5 mg/dL (ref 8.4–10.5)
Chloride: 103 meq/L (ref 96–112)
Creatinine, Ser: 0.86 mg/dL (ref 0.40–1.20)
GFR: 76.32 mL/min (ref >60.00)
Glucose, Bld: 89 mg/dL (ref 70–99)
Potassium: 4.3 meq/L (ref 3.5–5.1)
Sodium: 140 meq/L (ref 135–145)

## 2024-03-11 LAB — HEPATIC FUNCTION PANEL
ALT: 12 U/L (ref 0–35)
AST: 17 U/L (ref 0–37)
Albumin: 4.8 g/dL (ref 3.5–5.2)
Alkaline Phosphatase: 50 U/L (ref 39–117)
Bilirubin, Direct: 0.2 mg/dL (ref 0.0–0.3)
Total Bilirubin: 1.1 mg/dL (ref 0.2–1.2)
Total Protein: 7 g/dL (ref 6.0–8.3)

## 2024-03-11 LAB — LIPID PANEL
Cholesterol: 183 mg/dL (ref 0–200)
HDL: 99.4 mg/dL (ref >39.00)
LDL Cholesterol: 73 mg/dL (ref 0–99)
NonHDL: 83.98
Total CHOL/HDL Ratio: 2
Triglycerides: 55 mg/dL (ref 0.0–149.0)
VLDL: 11 mg/dL (ref 0.0–40.0)

## 2024-03-11 LAB — TSH: TSH: 2.71 u[IU]/mL (ref 0.35–5.50)

## 2024-03-11 LAB — VITAMIN D 25 HYDROXY (VIT D DEFICIENCY, FRACTURES): VITD: 50.06 ng/mL (ref 30.00–100.00)

## 2024-03-11 NOTE — Patient Instructions (Signed)
Follow up in 1 year or as needed We'll notify you of your lab results and make any changes if needed Keep up the good work on healthy diet and regular exercise- you look great! Call with any questions or concerns Stay Safe!  Stay Healthy! Happy Fall!!!

## 2024-03-11 NOTE — Progress Notes (Signed)
   Subjective:    Patient ID: Bailey Hernandez, female    DOB: 01-17-1971, 53 y.o.   MRN: [id number]  HPI CPE- UTD on mammo, colonoscopy.  Due for Tdap, flu.  Pap is scheduled  Patient Care Team    Relationship Specialty Notifications Start End  Mahlon Comer BRAVO, MD PCP - General Family Medicine  10/14/17   Rutherford Gain, MD Consulting Physician Obstetrics and Gynecology  10/14/17      Health Maintenance  Topic Date Due   HIV Screening  Never done   Hepatitis C Screening  Never done   Hepatitis B Vaccines 19-59 Average Risk (1 of 3 - 19+ 3-dose series) Never done   Pneumococcal Vaccine: 50+ Years (1 of 1 - PCV) Never done   DTaP/Tdap/Td (2 - Td or Tdap) 10/15/2022   Cervical Cancer Screening (HPV/Pap Cotest)  12/13/2023   Influenza Vaccine  01/03/2024   COVID-19 Vaccine (6 - 2025-26 season) 02/03/2024   Mammogram  04/01/2024   Colonoscopy  03/28/2031   Zoster Vaccines- Shingrix  Completed   HPV VACCINES  Aged Out   Meningococcal B Vaccine  Aged Out      Review of Systems Patient reports no vision/ hearing changes, adenopathy,fever, weight change,  persistant/recurrent hoarseness , swallowing issues, chest pain, palpitations, edema, persistant/recurrent cough, hemoptysis, dyspnea (rest/exertional/paroxysmal nocturnal), gastrointestinal bleeding (melena, rectal bleeding), abdominal pain, significant heartburn, bowel changes, GU symptoms (dysuria, hematuria, incontinence), Gyn symptoms (abnormal  bleeding, pain),  syncope, focal weakness, memory loss, numbness & tingling, skin/hair/nail changes, abnormal bruising or bleeding, anxiety, or depression.     Objective:   Physical Exam General Appearance:    Alert, cooperative, no distress, appears stated age  Head:    Normocephalic, without obvious abnormality, atraumatic  Eyes:    PERRL, conjunctiva/corneas clear, EOM's intact both eyes  Ears:    Normal TM's and external ear canals, both ears  Nose:   Nares normal, septum  midline, mucosa normal, no drainage    or sinus tenderness  Throat:   Lips, mucosa, and tongue normal; teeth and gums normal  Neck:   Supple, symmetrical, trachea midline, no adenopathy;    Thyroid : no enlargement/tenderness/nodules  Back:     Symmetric, no curvature, ROM normal, no CVA tenderness  Lungs:     Clear to auscultation bilaterally, respirations unlabored  Chest Wall:    No tenderness or deformity   Heart:    Regular rate and rhythm, S1 and S2 normal, no murmur, rub   or gallop  Breast Exam:    Deferred to GYN  Abdomen:     Soft, non-tender, bowel sounds active all four quadrants,    no masses, no organomegaly  Genitalia:    Deferred to GYN  Rectal:    Extremities:   Extremities normal, atraumatic, no cyanosis or edema  Pulses:   2+ and symmetric all extremities  Skin:   Skin color, texture, turgor normal, no rashes or lesions  Lymph nodes:   Cervical, supraclavicular, and axillary nodes normal  Neurologic:   CNII-XII intact, normal strength, sensation and reflexes    throughout          Assessment & Plan:

## 2024-03-11 NOTE — Addendum Note (Signed)
 Addended by: Kashae Carstens L on: 03/11/2024 08:22 AM   Modules accepted: Orders

## 2024-03-11 NOTE — Assessment & Plan Note (Signed)
 Pt's PE WNL.  UTD on mammo, colonoscopy.  Pap scheduled.  Will get flu shot at work.  Tdap given today.  Check labs.  Anticipatory guidance provided.

## 2024-03-12 LAB — HEPATITIS C ANTIBODY: Hepatitis C Ab: NONREACTIVE

## 2024-03-12 LAB — HIV ANTIBODY (ROUTINE TESTING W REFLEX)
HIV 1&2 Ab, 4th Generation: NONREACTIVE
HIV FINAL INTERPRETATION: NEGATIVE

## 2024-03-13 ENCOUNTER — Ambulatory Visit: Payer: Self-pay | Admitting: Family Medicine

## 2024-04-03 ENCOUNTER — Ambulatory Visit

## 2024-04-22 ENCOUNTER — Ambulatory Visit
Admission: RE | Admit: 2024-04-22 | Discharge: 2024-04-22 | Disposition: A | Source: Ambulatory Visit | Attending: Obstetrics and Gynecology | Admitting: Obstetrics and Gynecology

## 2024-04-22 DIAGNOSIS — Z1231 Encounter for screening mammogram for malignant neoplasm of breast: Secondary | ICD-10-CM

## 2025-03-15 ENCOUNTER — Encounter: Admitting: Family Medicine
